# Patient Record
Sex: Male | Born: 1945 | Race: Black or African American | Hispanic: No | Marital: Single | Smoking: Former smoker
Health system: Southern US, Community
[De-identification: ages and names within clinical notes are randomized; demographics above are authoritative.]

## PROBLEM LIST (undated history)

## (undated) DIAGNOSIS — J449 Chronic obstructive pulmonary disease, unspecified: Secondary | ICD-10-CM

## (undated) DIAGNOSIS — F419 Anxiety disorder, unspecified: Secondary | ICD-10-CM

## (undated) DIAGNOSIS — I739 Peripheral vascular disease, unspecified: Secondary | ICD-10-CM

## (undated) DIAGNOSIS — I771 Stricture of artery: Secondary | ICD-10-CM

## (undated) DIAGNOSIS — E119 Type 2 diabetes mellitus without complications: Secondary | ICD-10-CM

## (undated) DIAGNOSIS — R079 Chest pain, unspecified: Secondary | ICD-10-CM

## (undated) HISTORY — DX: Stricture of artery: I77.1

## (undated) HISTORY — DX: Peripheral vascular disease, unspecified: I73.9

---

## 2006-04-04 ENCOUNTER — Inpatient Hospital Stay (HOSPITAL_COMMUNITY): Admission: EM | Admit: 2006-04-04 | Discharge: 2006-04-06 | Payer: Self-pay | Admitting: Emergency Medicine

## 2006-05-24 ENCOUNTER — Inpatient Hospital Stay (HOSPITAL_COMMUNITY): Admission: RE | Admit: 2006-05-24 | Discharge: 2006-05-28 | Payer: Self-pay | Admitting: Cardiology

## 2006-06-19 ENCOUNTER — Ambulatory Visit: Payer: Self-pay | Admitting: Critical Care Medicine

## 2006-07-31 ENCOUNTER — Ambulatory Visit: Payer: Self-pay | Admitting: Critical Care Medicine

## 2007-10-02 DIAGNOSIS — J438 Other emphysema: Secondary | ICD-10-CM

## 2007-10-02 DIAGNOSIS — J309 Allergic rhinitis, unspecified: Secondary | ICD-10-CM | POA: Insufficient documentation

## 2007-10-02 DIAGNOSIS — R519 Headache, unspecified: Secondary | ICD-10-CM | POA: Insufficient documentation

## 2007-10-02 DIAGNOSIS — J4489 Other specified chronic obstructive pulmonary disease: Secondary | ICD-10-CM | POA: Insufficient documentation

## 2007-10-02 DIAGNOSIS — I10 Essential (primary) hypertension: Secondary | ICD-10-CM | POA: Insufficient documentation

## 2007-10-02 DIAGNOSIS — I219 Acute myocardial infarction, unspecified: Secondary | ICD-10-CM | POA: Insufficient documentation

## 2007-10-02 DIAGNOSIS — J449 Chronic obstructive pulmonary disease, unspecified: Secondary | ICD-10-CM

## 2007-10-02 DIAGNOSIS — J984 Other disorders of lung: Secondary | ICD-10-CM | POA: Insufficient documentation

## 2007-10-02 DIAGNOSIS — R51 Headache: Secondary | ICD-10-CM | POA: Insufficient documentation

## 2007-10-02 DIAGNOSIS — E119 Type 2 diabetes mellitus without complications: Secondary | ICD-10-CM

## 2007-10-02 DIAGNOSIS — R7309 Other abnormal glucose: Secondary | ICD-10-CM

## 2011-03-10 NOTE — Cardiovascular Report (Signed)
Edwin [name], Edwin [name]              ACCOUNT NO.:  [id number]   MEDICAL RECORD NO.:  [id number]          PATIENT TYPE:  INP   LOCATION:  2807                         FACILITY:  [facility]   PHYSICIAN:  Eduardo Osier. Sharyn Lull, M.D. DATE OF BIRTH:  1946/07/04   DATE OF PROCEDURE:  04/04/2006  DATE OF DISCHARGE:                              CARDIAC CATHETERIZATION   PROCEDURE:  1.  Left cardiac cath with selective left and right coronary angiograph, LV-      graphy via the right groin using Judkins technique.  2.  Successful PTCA to mid-RCA using 2.5 x 9-mm long Maverick balloon.  3.  Successful deployment of 2.5 x 23-mm long Cypher drug-eluting stent in      mid-RCA.  4.  Successful post dilatation of this stent using 2.75 x 8-mm long      PowerSail balloon.   INDICATIONS FOR THE PROCEDURE:  Edwin [name] is a 65 year old black male with  past medical history significant for coronary artery disease, history of MI  in the past, hypertension, insulin-requiring diabetes mellitus, COPD,  tobacco abuse, peripheral vascular disease, optic hypertrophy, organic brain  syndrome, history of head trauma in the past.  She came to the ER via EMS  complaining of retrosternal squeezing chest pain radiating to the neck  associated with diaphoresis, nausea and vomiting for last 3 days.  The  patient did not seek any medical attention, but the chest pain got worse,  yesterday, so decided to come to the ER.  EKG done in the ER showed sinus  bradycardia with right bundle branch block with Q waves in inferior leads  with minimal ST elevation in anterior leads; and was noted to have elevated  CPK/MB of 43.5 and troponin of 6.89.  The patient was started on IV heparin  and nitrates in the ER with relief of chest pain.   The patient, again, this morning had chest pain and was noted to have  progressive elevation of cardiac enzymes.  Repeat EKG showed no new  significant changes, due to typical anginal chest pain; and  recent inferior  wall MI and post infarction angina and elevated cardiac enzymes discussed  with the patient regarding left cath, possible PTCA stenting its risks, i.e.  death, MI, stroke, need for emergency CABG, local vascular complications,  risk of restenosis etcetera; and consented for the procedure.   DESCRIPTION OF PROCEDURE:  After obtaining the informed consent the patient  was brought to the cath lab and was placed on the fluoroscopy table.  The  right groin was prepped and draped in the usual fashion.  Then 2% Xylocaine  was used for local anesthesia in the right groin.  With the help of thin-  wall needle, 6-French arterial and venous sheaths were placed.  Both the  sheaths were aspirated and flushed.   Next, the 6-French left Judkins catheter was advanced over the wire under  fluoroscopic guidance up to the ascending aorta.  Wire was pulled out, the  catheter was aspirated and connected to the manifold.  Catheter was further  advanced and engaged into the left coronary ostium.  Multiple views of the  left system were taken.   Next, the catheter was disengaged and was pulled out over the wire and was  replaced with 6-French. right Judkins catheter which was advanced over the  wire under fluoroscopic guidance up to the ascending aorta.  Wire was pulled  out the catheter was aspirated and the connected to the manifold.  Catheter  was further advanced and engaged into the right coronary ostium.  Multiple  views of the right system were taken.   Next, the catheter was disengaged and was pulled out over the wire and was  replaced with 6-French pigtail catheter which was advanced over the wire  under fluoroscopic guidance up to the ascending aorta.  Wire was pulled out,  the catheter was aspirated and connected to the manifold.  Catheter was  further advanced across the aortic valve into the LV.  LV pressures were  recorded.   Next LV graft was done in 30-degree RAO position.   Post angiographic  pressures were recorded from LV and then pullback pressures were recorded  from the aorta.  There was no gradient across the aortic valve.   Next the pigtail catheter was pulled out over the wire.  Sheaths were  aspirated and flushed.   FINDINGS:  1.  LV showed mild global hypokinesia, EF of 50% left main has 10-15% distal      stenosis.  2.  LAD has 10-15% proximal and mid stenosis.  Diagonal-1 is very small      which is less than 1 mm.  Diagonal-2 is small which has 20-30% ostial      stenosis.  3.  Left circumflex has 60-70% napkin ring stenosis in the midportion OM-1      has 50-60% ostial stenosis and 80-85% mid stenosis.  4.  OM-2 has 30-40% ostial stenosis and then it is diffusely diseased      distally after bifurcation.  OM-3 and OM-4 were very small.  OM-5 was      moderate size which has mid bifurcation stenosis.  Vessel beyond the      midportion is very small which is not suitable for PCI.  RCA has 90-95%      mid stenosis.  Vessel is nondominant with TIMI 2 flow which was the      culprit lesion for her recent MI.  RCA was nondominant as above.      Interventional procedure successful PTCA to mid RCA was done using 2.5 x      9-mm long Maverick balloon for predilatation and then 2.5 x 23-mm long      Cypher drug-eluting stent was deployed in mid RCA at 15 and __________      pressure stent was postdilated using 2.75 x 8 mm long PowerSail balloon.      Lesion was dilated from 90-95% to 0% rest with excellent TIMI grade 3      distal flow without evidence of dissection or distal embolization.  The      patient received weight-based Angiomax and 300 additional mg of Plavix      during the procedure.  The patient tolerated the procedure well.  There      are no complications.  The patient was transferred to recovery room in      stable condition.           ______________________________  Eduardo Osier Sharyn Lull, M.D.     MNH/MEDQ  D:  04/04/2006  T:   04/04/2006  Job:  [id number] 

## 2011-03-10 NOTE — Assessment & Plan Note (Signed)
[facility]                               PULMONARY OFFICE NOTE   NAME:[name], Edwin PICKERING                     MRN:          [id number]  DATE:07/31/2006                            DOB:          1945-10-24    [name] returns today in followup. A 65 year old African-American male  has a lung nodule and chronic obstructive lung disease. He is a group home  and is not getting his Spiriva on a consistent basis. He does have the  documented pulmonary nodule for which we are currently following on an  active basis. He has a repeat CT scan that is pending.   He is on:  1. Aspirin 325 mg daily.  2. Omeprazole 20 mg daily.  3. Trazodone 50 mg h.s.  4. Citalopram 20 mg daily.  5. Lorazepam 0.5 mg 1/4 tablet daily.  6. Plavix 75 mg daily.  7. Altace 2.5 mg daily.  8. Lipitor 20 mg daily.  9. Isosorbide 30 mg daily.  10.Metformin 1000 mg b.i.d.  11.Spiriva 1 capsule daily.   On exam, temperature 98, blood pressure 110/70, pulse 63, saturation 96% on  room air.  CHEST:  Showed distant breath sounds with prolonged expiratory phase. No  wheeze or rhonchi noted.  CARDIAC EXAM:  Showed a regular rate and rhythm without S3. Normal S1 and  S2.  ABDOMEN:  Soft, nontender.  EXTREMITIES:  Showed no edema or clubbing.  SKIN:  Clear.   IMPRESSION:  1. Primary emphysema. He needs to get his Spiriva on a more regular basis      for this. We will remind the rest home in this regard.  2. Pulmonary nodule. A repeat CT scan will be scheduled for January of      this coming year. Return the patient in followup in 4 months.       Charlcie Cradle Delford Field, MD, FCCP   PEW/MedQ  DD:  07/31/2006 DT:  08/02/2006 Job #:  [id number]   cc:   Eduardo Osier. Sharyn Lull, M.D.

## 2011-03-10 NOTE — Assessment & Plan Note (Signed)
[facility]                               PULMONARY OFFICE NOTE   NAME:Edwin [name], ADOM SCHOENECK                     MRN:          [id number]  DATE:06/19/2006                            DOB:          [birth year]    CHIEF COMPLAINT:  Evaluate lung nodules and dyspnea.   HISTORY OF PRESENT ILLNESS:  This 65 year old African-American male was  hospitalized between the 2nd and 6th of August with stable angina and  coronary artery disease and COPD exacerbation, and found to have very small  bilateral pulmonary nodules on CT scanning.  The patient was referred to our  office for evaluation.  He still smokes a pack a week and has been smoking  for 40 years.  He is dyspneic with activity and at rest, has a productive  cough.  Has some chest discomfort, has some irregular heartbeats.  No real  reflux symptoms, no change in weight, no difficulty swallowing.  The patient  has no nasal congestion, sneezing, itching, earaches or anxiety.  The  patient is staying at the [facility] at this time.  The patient  is not on any inhaled medications at this time.   PAST MEDICAL HISTORY:  1. Hypertension.  2. Heart attack three months ago.  3. Diabetes.  4. Hyperglycemia.  5. Allergies.  6. Chronic headaches.  7. Stent placed in July.   MEDICATION ALLERGIES:  None.   CURRENT MEDICATIONS:  1. Aspirin 325 mg daily.  2. Omeprazole 20 mg daily.  3. Trazodone 50 mg h.s.  4. Citalopram 20 mg daily.  5. Lorazepam 0.5 mg 1/4-tab daily.  6. Plavix 75 mg daily.  7. Altace 2.5 mg daily.  8. Lipitor 20 mg daily.  9. Isosorbide 30 mg daily.  10.Metformin 1000 mg b.i.d..   FAMILY HISTORY:  Allergies in brothers and sisters, cancer in a sister who  had lung cancer.   REVIEW OF SYSTEMS:  Otherwise noncontributory.   PHYSICAL EXAMINATION:  GENERAL:  This is a thin African-American male in no  distress.  VITAL SIGNS:  Temp 97.7, blood pressure 100/74, pulse 66,  saturation 99% on  room air.  CHEST:  Showed diminished breath sounds with prolonged  respiratory phase.  No wheeze or rhonchi noted.  CARDIAC:  Showed a regular rate and rhythm without S3, normal S1, S2.  ABDOMEN:  Soft, nontender.  EXTREMITIES:  No edema or clubbing.  SKIN:  Clear.  NEUROLOGIC:  Intact.  HEENT:  No jugular venous distention, no lymphadenopathy.  Oropharynx clear.  NECK:  Supple.   CT scan was obtained and reviewed and did show small bilateral noncalcified  lung nodules which are likely benign in nature.   IMPRESSION:  1. Moderate obstructive lung disease with chronic air flow obstruction.      For this, will begin Spiriva 1 capsule daily and encourage smoking      cessation.  2. Lung nodules.  We will follow it expectantly.  Will get a repeat CT      scan again in six months.  The patient will return to this office in  any case in four months for followup.                                   Charlcie Cradle Delford Field, MD, FCCP   PEW/MedQ  DD:  06/20/2006  DT:  06/21/2006  Job #:  [id number]   cc:   Eduardo Osier. Sharyn Lull, MD

## 2011-03-10 NOTE — Cardiovascular Report (Signed)
Edwin [name], Edwin [name]              ACCOUNT NO.:  [id number]   MEDICAL RECORD NO.:  [id number]          PATIENT TYPE:  INP   LOCATION:  2807                         FACILITY:  [facility]   PHYSICIAN:  Eduardo Osier. Sharyn Lull, M.D. DATE OF BIRTH:  Nov 01, 1945   DATE OF PROCEDURE:  05/24/2006  DATE OF DISCHARGE:                              CARDIAC CATHETERIZATION   PROCEDURE:  1.  Left cardiac catheterization with selective left and right coronary      angiography via right groin using Judkins technique.  2.  Successful PTCA to mid obtuse marginal 1 using 2 x 8 mm long and then      2.5 x 15 mm long Voyager balloon.  3.  Successful deployment of 2.5 x 23 mm long Cypher drug-eluting stent in      mid obtuse marginal 1.   INDICATION FOR THE PROCEDURE:  Edwin [name] is a 65 year old black male with  past medical history significant for coronary artery disease, history of  inferior wall MI in June of 2007.  Had PTCA and stenting to mid RCA.  Had  history of remote MI in the past, hypertension, insulin-requiring diabetes  mellitus, COPD, tobacco abuse, peripheral vascular disease, dementia,  history of head trauma in the past.  Complains of occasional retrosternal  chest pain without associated symptoms.  Patient had inferior wall MI in  June of 2007.  Subsequently had PCI to RCA and was noted to have 80-85% OM1  mid stenosis.  Patient is admitted for possible PCI to OM1.  Discussed with  patient and his sister regarding PTCA and stenting to OM1, its risks and  benefits, i.e., death, stroke, need for emergency CABG, risk of restenosis,  local vascular complications, etc. and consented for the procedure.   PROCEDURE:  After obtaining the informed consent patient was brought to the  catheterization laboratory and was placed on fluoroscopy table.  Right groin  was prepped and draped in usual fashion.  2% Xylocaine was used for local  anesthesia in the right groin.  With the help of thin-wall needle  6-French  arterial and 5-French venous sheaths were placed.  Both the sheaths were  aspirated and flushed.  Next, 6-French right Judkins catheter was advanced  over the wire under fluoroscopic guidance up to the ascending aorta.  Wire  was pulled out.  The catheter was aspirated and connected to the manifold.  Catheter was further advanced and engaged into right coronary ostium.  A  single view of right coronary artery was obtained.  Next, the catheter was  disengaged and was pulled out over the wire and was replaced with a 6-French  3 XB guiding catheter.  Was advanced over the wire under fluoroscopic  guidance up to the ascending aorta.  Wire was pulled out.  The catheter was  aspirated and connected to the manifold.  Catheter was further advanced and  engaged into left coronary ostium.  Multiple views of the left system were  taken.   FINDINGS:  LV was not done.  Left main was patent.  LAD has mild disease as  before.  Left circumflex  has mild disease as before.  OM1 has 50-60% ostial  stenosis and 95% mid stenosis with TIMI 3 flow.  OM2 was small which has 50-  60% mid stenosis.  RCA is nondominant which was patent at prior PTCA and  stented site.   INTERVENTIONAL PROCEDURE:  Successful PTCA to mid OM1 was done using 2 x 8  mm long Voyager balloon and then 2.5 x 15 mm long Voyager balloon for pre  dilatation going up to 8 atmospheres pressure.  Angiogram showed persistent  elastic recoil in mid OM1 and then a 2.5 x 23 mm long Cypher drug-eluting  stent was deployed at 10 atmospheres of pressure which was fully expanded  going up to 18 atmospheres pressure in mid OM1.  Lesion was dilated from 95%  to 0% residual with excellent TIMI grade 3 distal flow without evidence of  dissection or distal embolization.  Patient received weight-based Angiomax  and 300 mg of Plavix during the procedure.  Patient tolerated the procedure  well.  There were no complications.  Patient was transferred to  recovery  room in stable condition.           ______________________________  Eduardo Osier Sharyn Lull, M.D.     MNH/MEDQ  D:  05/24/2006  T:  05/24/2006  Job:  [id number]   cc:   Cath Lab

## 2011-03-10 NOTE — Discharge Summary (Signed)
NAMEGABE, Edwin [name]              ACCOUNT NO.:  [id number]   MEDICAL RECORD NO.:  [id number]          PATIENT TYPE:  INP   LOCATION:  3732                         FACILITY:  [facility]   PHYSICIAN:  Mohan N. Sharyn Lull, M.D. DATE OF BIRTH:  [birth year]   DATE OF ADMISSION:  04/03/2006  DATE OF DISCHARGE:  04/06/2006                                 DISCHARGE SUMMARY   ADMITTING DIAGNOSES:  1.  Probable recent inferior wall myocardial infarction post infarct angina.  2.  Coronary artery disease.  3.  History of myocardial infarction in the past.  4.  Hypertension.  5.  Insulin-requiring diabetes mellitus.  6.  Organic brain syndrome.  7.  Chronic obstructive pulmonary disease.  8.  Tobacco abuse.  9.  Peripheral vascular disease.  10. History of optic atrophy.  11. History of head trauma in the past.   FINAL DIAGNOSES:  1.  Status post recent inferior wall myocardial infarction.  2.  Status post infarct angina.  3.  Status post percutaneous transluminal coronary angioplasty stenting to      mid right coronary artery.  4.  Coronary artery disease.  5.  History of myocardial infarction in the past.  6.  Hypertension.  7.  Insulin-requiring diabetes mellitus.  8.  Organic brain syndrome.  9.  Chronic obstructive pulmonary disease.  10. Tobacco abuse.  11. Peripheral vascular disease.  12. History of optic atrophy.  13. History of head trauma in the past.   DISCHARGE MEDICATIONS:  1.  Enteric coated aspirin 325 mg 1 tablet daily.  2.  Plavix 75 mg 1 tablet daily with food.  3.  Altace 2.5 mg 1 capsule daily.  4.  Lipitor 20 mg 1 tablet daily.  5.  Omeprazole 20 mg 1 capsule daily.  6.  Imdur 30 mg 1 tablet daily in the morning.  7.  Albuterol meter-dose inhaler 2 puffs every 6 hours as before.  8.  Lantus insulin 10 units subcu daily at night as before.  9.  Celexa 20 mg daily as before.  10. Trazodone 50 mg one daily at night.  11. Nitrostat sublingual 0.4 mg as before.  12.  Tylenol 500 mg every 6 hours as needed for headache.   DIET:  Low salt, low cholesterol, 1800 calories ADA diet.   Post PTCA stent instructions have been given.   Follow-up with me in one week.   CONDITION AT DISCHARGE:  Stable.   BRIEF HISTORY AND HOSPITAL COURSE:  Edwin [name] is a 65 year old black male  with past medical history significant for multiple medical problems, i.e.,  coronary artery disease, history of MI in the past, hypertension, insulin-  requiring diabetes mellitus, COPD, tobacco abuse, PVD, optic atrophy,  organic brain syndrome, history of head trauma in the past.  He came to the  ER complaining of retrosternal chest squeezing, chest pain radiating to the  neck associated with diaphoresis, nausea and vomiting for last three days.  The patient did not seek medical attention and pain got worse yesterday and  today, so decided to come the ER by EMS.  EKG done in  the ER showed sinus  bradycardia, right bundle branch block with Q-waves in inferior leads with  minimal ST elevation and was noted to have elevated CPK-MB of 43.5 and  troponin of 6.89.  The patient was started on IV heparin and nitrates in the  ER with relief of chest pain.  The patient presently denies chest pain,  shortness of breath, nausea, vomiting or diaphoresis.   PAST MEDICAL HISTORY:  As above.   PAST SURGICAL HISTORY:  None.   ALLERGIES:  NO KNOWN DRUG ALLERGIES.   MEDICATION AT HOME:  1.  He was on aspirin 325 mg p.o. daily.  2.  Hydrochlorothiazide 12.5 mg p.o. daily.  3.  Nitrostat sublingual p.r.n.  4.  Albuterol inhaler.  5.  Trazodone 50 mg nightly.  6.  Prilosec 20 mg p.o. daily.  7.  Lantus 10 units nightly.  8.  Feosol 325 daily.  9.  Celexa 20 mg p.o. daily.  10. Ativan 0.125 mg p.o. daily.   PHYSICAL EXAMINATION:  GENERAL APPEARANCE:  He was alert, awake and oriented  x3 in no acute distress.  VITAL SIGNS:  Blood pressure was 134/90, pulse was 62.  HEENT:  Conjunctiva was  pink.  NECK:  Supple.  No JVD, no bruit.  LUNGS:  He has bilateral expiratory wheezing.  CARDIOVASCULAR:  S1 and S2 was normal.  There was soft S4 gallop.  ABDOMEN:  Soft.  Bowel sounds were present, nontender.  EXTREMITIES:  There is no clubbing, cyanosis or edema.   LABORATORY DATA:  His CPK-MB point of care was 45.  Repeat was 43.5.  Troponin I was 5.73 and 6.89.  His cholesterol was 193, HDL was 40, LDL was  140.  CPK-MB by labs, CK was 688, MB 64.9, relative index 9.4; second set CK  708, MB 63, related index 8.9; fourth set CK of 418, MB 27.6, relative index  6.6.  On April 05, 2006, CK was 334, MB 17.1, relative index 5.1.  Today his  CK is 166, MB 4.7, troponin has come down to 4.52.  His first troponin was  9.64, 12.88, 7.89, 6.13, today is 4.52.  His sodium was 139, potassium 3.7,  chloride 104, bicarb 29, glucose 91, BUN 9, creatinine 0.7.  His hemoglobin  A1c was 6.5. Hemoglobin was 15, hematocrit 45.7, white count of 11.5.   BRIEF HOSPITAL COURSE:  The patient was admitted to CCU.  The patient  underwent left cardiac cath with selective left and right coronary  angiography  and PTCA and stenting to RCA as per procedure report.  The  patient did not have any further episodes of chest pain following the  procedure.  His cardiac panels have gradually come down towards normal.  His  groin is stable with no evidence of hematoma.  The patient remains  bradycardiac but is completely asymptomatic.   The patient will be discharged home on above medications and will be  followed up in my office in one week.  The patient will be rescheduled for  PCI to obtuse marginal in a few weeks.           ______________________________  Eduardo Osier Sharyn Lull, M.D.     MNH/MEDQ  D:  04/06/2006  T:  04/06/2006  Job:  [id number]

## 2011-03-10 NOTE — Discharge Summary (Signed)
NAME:  Edwin [name], Edwin [name]              ACCOUNT NO.:  [id number]   MEDICAL RECORD NO.:  [id number]          PATIENT TYPE:  INP   LOCATION:  3736                         FACILITY:  [facility]   PHYSICIAN:  Edwin [name], M.D. DATE OF BIRTH:  1945-10-30   DATE OF ADMISSION:  05/24/2006  DATE OF DISCHARGE:  05/28/2006                                 DISCHARGE SUMMARY   ADMITTING DIAGNOSES:  1.  Stable angina.  2.  Coronary artery disease.  3.  History of recent inferior wall myocardial infarction in June 2007.  4.  Two-vessel coronary artery disease.  5.  Hypertension.  6.  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  7.  Hypercholesteremia.  8.  Dementia.  9.  History of head trauma.   FINAL DIAGNOSES:  1.  Status post exacerbation of chronic obstructive pulmonary disease.  2.  Very small bilateral pulmonary nodules.  3.  Coronary artery disease.  4.  Stable angina.  5.  Status post PTCA/stenting to obtuse marginal 1.  6.  History of recent inferior wall myocardial infarction.  7.  Status post PCI to right coronary artery in June of 2007.  8.  Hypertension.  9.  Dementia.  10. Diabetes mellitus controlled by diet.  11. Tobacco abuse.  12. History of head trauma in the past.  13. Hypercholesteremia.   DISCHARGE HOME MEDICATIONS:  1.  Enteric-coated aspirin 325 mg one tablet daily for one month and then 81      mg one tablet daily.  2.  Plavix 75 mg one tablet daily with food.  3.  Altace 2.5 mg one capsule daily.  4.  Lipitor 20 mg one tablet daily.  5.  Omeprazole 20 mg one capsule daily.  6.  Imdur 30 mg one tablet every morning.  7.  Albuterol two puffs four times per day as needed.  8.  Tylenol 500 mg every six to eight hours as needed for headache.  9.  Celexa 20 mg one daily.  10. Trazodone 50 mg one daily.  11. Avelox 400 mg one tablet daily for five more days.  12. Nitrostat 0.4 mg sublingual use as directed.   DIET:  Low salt, low cholesterol 1800 calories ADA  diet.  Post PTCA and  stent instructions have been given.  Follow up with me in one week and   Pulmonary in two weeks.   CONDITION AT DISCHARGE:  Stable.   BRIEF HISTORY AND HOSPITAL COURSE:  Edwin [name] is a 65 year old black male  with past medical history significant for coronary artery disease, history  of status post inferior wall MI in June of 2007, had PCI to RCA,  hypertension, insulin-requiring diabetes mellitus in the past, COPD, tobacco  abuse, peripheral vascular disease, history of head trauma, dementia.  Complains of occasional retrosternal chest pain without associated symptoms.  Patient had inferior wall MI in June of 2007 requiring PCI to RCA and was  noted to have 80-85% OM1 mid stenosis.  Patient is admitted for possible PCI  to OM1.  Patient denies any palpitation, lightheadedness, or syncope.  Denies shortness of breath.  PAST MEDICAL HISTORY:  As above.   PAST SURGICAL HISTORY:  None.   ALLERGIES:  None.   MEDICATION AT HOME:  1.  Aspirin 325 mg p.o. daily.  2.  Plavix 75 mg p.o. daily.  3.  Lipitor 20 mg p.o. daily.  4.  Omeprazole 20 mg p.o. daily.  5.  Imdur 30 mg p.o. q.a.m.  6.  Lantus insulin was discontinued.  7.  Celexa 20 mg p.o. daily.  8.  Trazodone 50 mg p.o. daily.  9.  Albuterol two puffs q.i.d.   SOCIAL HISTORY:  He is widowed.  Resident of [facility].  Smoked two  to three packs per day for 40+ years.  Used to drink socially.  Worked as  Education administrator in the past.  Born in [place].   FAMILY HISTORY:  Noncontributory.   PHYSICAL EXAMINATION:  GENERAL:  He was alert, awake, oriented x3.  No acute  distress.  VITAL SIGNS:  Blood pressure was 110/70, pulse was 64, regular.  HEENT:  Conjunctivae was pink.  NECK:  Supple.  No JVD, no bruit.  LUNGS:  Clear to auscultation without rhonchi or rales.  CARDIOVASCULAR:  S1, S2 was normal.  There was soft systolic murmur.  There  was no S3 gallop.  ABDOMEN:  Soft.  Bowel sounds were  present, nontender.  EXTREMITIES:  There is no clubbing, cyanosis, or edema.   LABORATORIES:  Chest x-ray preoperative done on August 2 showed three vague  nodular densities in the left lung which were new as compared to prior chest  x-ray done in June of 2007.  Otherwise, there was no acute cardiopulmonary  findings, stable hyperinflation and probable changes of COPD.  CT of the  chest done on August 4 showed bilateral intraparenchymal and subpleural  pulmonary nodules all measuring less than 1 cm.  There were also  hypervascular lesions identified within the liver.  His other laboratories:  Hemoglobin was 12.9, hematocrit 38.8, white count of 15.8.  Repeat  hemoglobin on today is 13.3, hematocrit 39.9, white count of 9.4 which has  been stable.  His white count has come down significantly from 15.8 to 9.4.  His sodium was 142, potassium 3.7, blood sugar was 102, bicarbonate was 27,  BUN 10, creatinine 0.8.  Post procedure CPK was 133, MB 5.   BRIEF HOSPITAL COURSE:  Patient was a.m. admit and underwent left cardiac  catheterization and PTCA stenting to OM1 as per procedure report.  Chest x-  ray, as stated above, showed nonspecific pulmonary nodules requiring CT of  the chest which showed bilateral very small pulmonary nodules.  Patient did  not have any episodes of chest pain during the hospital stay.  Patient did  had coughing with expiratory wheezing requiring bronchodilator therapy and  Avelox with improvement in his symptoms.  Patient remained afebrile during  the hospital stay.  Phase I cardiac rehab was called.  Patient has been  ambulating in hallway without any problems.  His groin is stable.  Spoke  with Dr. Shan [name] regarding bilateral pulmonary nodules and will be  followed up with [facility] as outpatient in few weeks.           ______________________________  Edwin [name], M.D.    MNH/MEDQ  D:  05/28/2006  T:  05/28/2006  Job:  [id number]   cc:    Edwin [name], M.D. [facility]

## 2013-07-19 ENCOUNTER — Emergency Department (HOSPITAL_COMMUNITY)
Admission: EM | Admit: 2013-07-19 | Discharge: 2013-07-19 | Disposition: A | Discharge status: Home / Self Care | Payer: Medicare Other | Attending: Emergency Medicine | Admitting: Emergency Medicine

## 2013-07-19 ENCOUNTER — Encounter (HOSPITAL_COMMUNITY): Payer: Self-pay | Admitting: Emergency Medicine

## 2013-07-19 ENCOUNTER — Emergency Department (HOSPITAL_COMMUNITY): Payer: Medicare Other

## 2013-07-19 DIAGNOSIS — J441 Chronic obstructive pulmonary disease with (acute) exacerbation: Secondary | ICD-10-CM | POA: Insufficient documentation

## 2013-07-19 DIAGNOSIS — F0390 Unspecified dementia without behavioral disturbance: Secondary | ICD-10-CM | POA: Insufficient documentation

## 2013-07-19 DIAGNOSIS — F17200 Nicotine dependence, unspecified, uncomplicated: Secondary | ICD-10-CM | POA: Insufficient documentation

## 2013-07-19 HISTORY — DX: Chronic obstructive pulmonary disease, unspecified: J44.9

## 2013-07-19 HISTORY — DX: Type 2 diabetes mellitus without complications: E11.9

## 2013-07-19 HISTORY — DX: Chest pain, unspecified: R07.9

## 2013-07-19 LAB — CBC WITH DIFFERENTIAL/PLATELET
Hemoglobin: 14.8 g/dL (ref 13.0–17.0)
Lymphocytes Relative: 25 % (ref 12–46)
Lymphs Abs: 2.4 10*3/uL (ref 0.7–4.0)
MCV: 88.5 fL (ref 78.0–100.0)
Neutro Abs: 6.6 10*3/uL (ref 1.7–7.7)
Neutrophils Relative %: 69 % (ref 43–77)
Platelets: 188 10*3/uL (ref 150–400)
WBC: 9.6 10*3/uL (ref 4.0–10.5)

## 2013-07-19 LAB — COMPREHENSIVE METABOLIC PANEL
BUN: 15 mg/dL (ref 6–23)
Calcium: 9.6 mg/dL (ref 8.4–10.5)
Chloride: 99 mEq/L (ref 96–112)
Creatinine, Ser: 0.92 mg/dL (ref 0.50–1.35)
GFR calc non Af Amer: 85 mL/min — ABNORMAL LOW (ref >90)
Sodium: 139 mEq/L (ref 135–145)
Total Bilirubin: 0.3 mg/dL (ref 0.3–1.2)

## 2013-07-19 LAB — POCT I-STAT TROPONIN I: Troponin i, poc: 0 ng/mL (ref 0.00–0.08)

## 2013-07-19 LAB — PRO B NATRIURETIC PEPTIDE: Pro B Natriuretic peptide (BNP): 341.4 pg/mL — ABNORMAL HIGH (ref 0–125)

## 2013-07-19 MED ORDER — ALBUTEROL SULFATE (5 MG/ML) 0.5% IN NEBU
5.0000 mg | INHALATION_SOLUTION | Freq: Once | RESPIRATORY_TRACT | Status: AC
  Administered 2013-07-19: 5 mg via RESPIRATORY_TRACT
  Filled 2013-07-19: qty 1

## 2013-07-19 MED ORDER — IPRATROPIUM BROMIDE 0.02 % IN SOLN
0.5000 mg | Freq: Once | RESPIRATORY_TRACT | Status: AC
  Administered 2013-07-19: 0.5 mg via RESPIRATORY_TRACT
  Filled 2013-07-19: qty 2.5

## 2013-07-19 MED ORDER — PREDNISONE (PAK) 10 MG PO TABS: 10.0000 mg | ORAL_TABLET | Freq: Every day | ORAL | Status: DC

## 2013-07-19 MED ORDER — METHYLPREDNISOLONE SODIUM SUCC 125 MG IJ SOLR
125.0000 mg | Freq: Once | INTRAMUSCULAR | Status: AC
  Administered 2013-07-19: 125 mg via INTRAVENOUS
  Filled 2013-07-19: qty 2

## 2013-07-19 NOTE — ED Notes (Signed)
Pt arrived from [facility] with c/o SOB. Pt lung sounds are wheezing and diminished. Hx of COPD and current smoker. EMS administered Albuterol neb tx. O2sat 100% at this time. BP-110/55 HR-80 No complaints of pain or SOB at this time.

## 2013-07-19 NOTE — ED Provider Notes (Signed)
Medical screening examination/treatment/procedure(s) were conducted as a shared visit with non-physician practitioner(s) and myself.  I personally evaluated the patient during the encounter  Flint Melter, MD 07/19/13 [phone]

## 2013-07-19 NOTE — ED Provider Notes (Signed)
CSN: [id number]     Arrival date & time 07/19/13  1043 History   First MD Initiated Contact with Patient 07/19/13 1045     Chief Complaint  Patient presents with  . Shortness of Breath   (Consider location/radiation/quality/duration/timing/severity/associated sxs/prior Treatment) Patient is a 67 y.o. male presenting with shortness of breath. The history is provided by the patient, the nursing home and the EMS personnel.  Shortness of Breath Associated symptoms: cough and wheezing   Associated symptoms: no abdominal pain, no chest pain, no fever, no sore throat and no vomiting   Per EMS, they were called to nursing home for patient's complaints of SOB.  Upon arrival, pt denied any complaints.  States to me that he has had gradually worsening SOB and dry cough for 3-4 months.  Also with rhinorrhea. Denies any chest pain.  Denies fever, chills, sore throat, abdominal pain, N/V/D.  Pt lives in [facility].  Per EMS there was no concern by staff for AMS and no hx dementia in patient's record.  Patient denies having any hx COPD or lung problems, though he was previously seen for COPD. States he does not know where he lives and does not know what medications he takes.  11:18 AM I spoke with "Tisha" - staff member at [facility].  Morrison Old states patient was complaining of chest pain, cold, flu-like symptoms since last week.  Was seen by Doctor 4 days ago who prescribed mucinex without any improvement.  Morrison Old states that patient's orientation only to self and lack of knowledge of where he lives or his medical problems is baseline for him, that he has been like that since April when he came to live there. Pt has not formally been diagnosed with dementia.   Level V caveat for ?dementia   No past medical history on file. No past surgical history on file. No family history on file. History  Substance Use Topics  . Smoking status: Not on file  . Smokeless tobacco: Not on file  . Alcohol Use: Not on file     Review of Systems  Unable to perform ROS: Dementia  Constitutional: Negative for fever and chills.  HENT: Positive for rhinorrhea. Negative for sore throat.   Respiratory: Positive for cough, shortness of breath and wheezing.   Cardiovascular: Negative for chest pain.  Gastrointestinal: Negative for nausea, vomiting, abdominal pain and diarrhea.    Allergies  Review of patient's allergies indicates no known allergies.  Home Medications  No current outpatient prescriptions on file. There were no vitals taken for this visit. Physical Exam  Nursing note and vitals reviewed. Constitutional: He appears well-developed and well-nourished. No distress.  HENT:  Head: Normocephalic and atraumatic.  Neck: Neck supple.  Cardiovascular: Normal rate and regular rhythm.   Pulmonary/Chest: Effort normal. No respiratory distress. He has no decreased breath sounds. He has wheezes. He has no rhonchi. He has no rales.  Diffuse respiratory wheezes  Abdominal: Soft. He exhibits no distension and no mass. There is no tenderness. There is no rebound and no guarding.  Musculoskeletal: He exhibits no edema.  Neurological: He is alert. He exhibits normal muscle tone.  Oriented to self.  Knows he is in the hospital and in [place] and he knows he is here for SOB.  Does not know day, month, year, or president.  When asked to repeat 3 words back to me he is gets 2/3 correct each time immediately.   Skin: He is not diaphoretic.    ED  Course  Procedures (including critical care time) Labs Review Labs Reviewed  CBC WITH DIFFERENTIAL - Abnormal; Notable for the following:    MCHC 36.2 (*)    All other components within normal limits  COMPREHENSIVE METABOLIC PANEL - Abnormal; Notable for the following:    Glucose, Bld 158 (*)    GFR calc non Af Amer 85 (*)    All other components within normal limits  PRO B NATRIURETIC PEPTIDE - Abnormal; Notable for the following:    Pro B Natriuretic peptide (BNP)  341.4 (*)    All other components within normal limits  POCT I-STAT TROPONIN I  POCT I-STAT TROPONIN I   Imaging Review Dg Chest 2 View  07/19/2013   CLINICAL DATA:  Initial evaluation for COPD exacerbation with shortness of breath. Chronic cough.  EXAM: CHEST  2 VIEW  COMPARISON:  Two-view chest x-ray 05/24/2006, 04/03/2006. CT chest 05/26/2006.  FINDINGS: Cardiac silhouette normal in size, unchanged. Thoracic aorta mildly atherosclerotic, unchanged. Hilar and mediastinal contours otherwise unremarkable. Hyperinflation with emphysematous changes in the upper lobes. Biapical parenchymal scarring, unchanged. Cyber poor pulmonary nodule identified on the prior CT are not visualized on the current examination a and therefore have certainly not increased in size in the 7 year interval, indicating benignity. No confluent airspace consolidation. No pleural effusions. Visualized bony thorax intact.  IMPRESSION: COPD/emphysema. No acute cardiopulmonary disease.   Electronically Signed   By: Hulan Saas   On: 07/19/2013 12:28    11:13 AM I spoke with Morrison Old from [facility] (please see HPI for detail)  Filed Vitals:   07/19/13 1400  BP: 126/88  Pulse:   Temp:   Resp: 19     Date: 07/19/2013  Rate: 95  Rhythm: normal sinus rhythm and ventricular bigeminy  QRS Axis: normal  Intervals: normal  ST/T Wave abnormalities: nonspecific ST/T changes  Conduction Disutrbances:right bundle branch block  Narrative Interpretation:   Old EKG Reviewed: none available    11:55 AM Dr Effie Shy made aware of patient.   Wheezing much improved after breathing treatment.  Pt reports he feels much better, denies SOB.    MDM   1. COPD exacerbation    Pt with hx COPD, active smoker, presents with gradually worsening SOB and cough/cold symptoms x 1-2 weeks. Per nursing home staff he is at his baseline mentally.  Likely has dementia.  No other concerns.  Pt wheezing on exam initially, improved with neb  treatment.  D/C home with prednisone.  Repeat troponins negative. Pt denies chest pain throughout visit. Nonischemic EKG.  Likely COPD exacerbation.  Pt feeling much better after neb treatment.  Pt d/c back to facility with return precautions.     Trixie Dredge, PA-C 07/19/13 1528

## 2013-07-19 NOTE — ED Provider Notes (Signed)
  Face-to-face evaluation   History: Patient sent here from his facility for evaluation of shortness of breath. The patient is asymptomatic, and does not know why.   Physical exam: Alert, elderly man in mild distress. Chest nontender to palpation. Heart regular rate and rhythm. Lungs clear to auscultation. Neurologic- alert and oriented to place, only. Normal strength and motion in arms, and legs, bilaterally.  Medical screening examination/treatment/procedure(s) were conducted as a shared visit with non-physician practitioner(s) and myself.  I personally evaluated the patient during the encounter  Flint Melter, MD 07/19/13 [phone]

## 2013-07-19 NOTE — ED Notes (Signed)
PTAR paged. 

## 2013-08-13 ENCOUNTER — Encounter (HOSPITAL_COMMUNITY): Payer: Self-pay | Admitting: Emergency Medicine

## 2013-08-13 ENCOUNTER — Emergency Department (HOSPITAL_COMMUNITY)
Admission: EM | Admit: 2013-08-13 | Discharge: 2013-08-13 | Disposition: A | Discharge status: Home / Self Care | Payer: Medicare Other | Attending: Emergency Medicine | Admitting: Emergency Medicine

## 2013-08-13 DIAGNOSIS — J449 Chronic obstructive pulmonary disease, unspecified: Secondary | ICD-10-CM | POA: Insufficient documentation

## 2013-08-13 DIAGNOSIS — E119 Type 2 diabetes mellitus without complications: Secondary | ICD-10-CM | POA: Insufficient documentation

## 2013-08-13 DIAGNOSIS — E7800 Pure hypercholesterolemia, unspecified: Secondary | ICD-10-CM | POA: Insufficient documentation

## 2013-08-13 DIAGNOSIS — F17200 Nicotine dependence, unspecified, uncomplicated: Secondary | ICD-10-CM | POA: Insufficient documentation

## 2013-08-13 DIAGNOSIS — H5789 Other specified disorders of eye and adnexa: Secondary | ICD-10-CM | POA: Insufficient documentation

## 2013-08-13 DIAGNOSIS — T7840XA Allergy, unspecified, initial encounter: Secondary | ICD-10-CM

## 2013-08-13 DIAGNOSIS — Z79899 Other long term (current) drug therapy: Secondary | ICD-10-CM | POA: Insufficient documentation

## 2013-08-13 DIAGNOSIS — IMO0002 Reserved for concepts with insufficient information to code with codable children: Secondary | ICD-10-CM | POA: Insufficient documentation

## 2013-08-13 DIAGNOSIS — F411 Generalized anxiety disorder: Secondary | ICD-10-CM | POA: Insufficient documentation

## 2013-08-13 DIAGNOSIS — J4489 Other specified chronic obstructive pulmonary disease: Secondary | ICD-10-CM | POA: Insufficient documentation

## 2013-08-13 HISTORY — DX: Anxiety disorder, unspecified: F41.9

## 2013-08-13 MED ORDER — PREDNISONE 20 MG PO TABS: ORAL_TABLET | ORAL | Status: DC

## 2013-08-13 MED ORDER — FAMOTIDINE 20 MG PO TABS
20.0000 mg | ORAL_TABLET | Freq: Once | ORAL | Status: AC
  Administered 2013-08-13: 20 mg via ORAL
  Filled 2013-08-13: qty 1

## 2013-08-13 MED ORDER — FLUORESCEIN SODIUM 1 MG OP STRP
1.0000 | ORAL_STRIP | Freq: Once | OPHTHALMIC | Status: AC
  Administered 2013-08-13: 1 via OPHTHALMIC
  Filled 2013-08-13: qty 1

## 2013-08-13 MED ORDER — DIPHENHYDRAMINE HCL 25 MG PO TABS: 25.0000 mg | ORAL_TABLET | Freq: Four times a day (QID) | ORAL | Status: DC

## 2013-08-13 MED ORDER — DIPHENHYDRAMINE HCL 25 MG PO CAPS
25.0000 mg | ORAL_CAPSULE | Freq: Once | ORAL | Status: AC
  Administered 2013-08-13: 25 mg via ORAL
  Filled 2013-08-13: qty 1

## 2013-08-13 MED ORDER — TETRACAINE HCL 0.5 % OP SOLN
1.0000 [drp] | Freq: Once | OPHTHALMIC | Status: AC
  Administered 2013-08-13: 1 [drp] via OPHTHALMIC
  Filled 2013-08-13: qty 2

## 2013-08-13 MED ORDER — PREDNISONE 20 MG PO TABS
60.0000 mg | ORAL_TABLET | Freq: Once | ORAL | Status: AC
  Administered 2013-08-13: 60 mg via ORAL
  Filled 2013-08-13: qty 3

## 2013-08-13 MED ORDER — FAMOTIDINE 20 MG PO TABS: 20.0000 mg | ORAL_TABLET | Freq: Two times a day (BID) | ORAL | Status: DC

## 2013-08-13 NOTE — ED Notes (Signed)
PTAR in to transport pt.

## 2013-08-13 NOTE — ED Notes (Signed)
Rt eye swolllen since getting up this am denies injury lives in assisted living 130/88 hr 72

## 2013-08-13 NOTE — ED Provider Notes (Signed)
CSN: [id number]     Arrival date & time 08/13/13  1039 History  This chart was scribed for non-physician practitioner Junious Silk, PA-C working with Laray Anger, DO by Danella Maiers, ED Scribe. This patient was seen in room TR04C/TR04C and the patient's care was started at 10:51 AM.   Chief Complaint  Patient presents with  . Eye Pain   The history is provided by the patient. No language interpreter was used.   HPI Comments: Edwin [name] is a 67 y.o. male with a h/o DM, high cholesterol who presents to the Emergency Department from Assisted Living complaining of swelling to the right eye since he woke this morning. He denies injuries or falls, pain, itching, visual changes, drainage. He does not have pain with EOMs, photophobia. He does not wear glasses or contacts. He denies h/o of similar problem. He denies existing allergies or new exposures.   Past Medical History  Diagnosis Date  . COPD (chronic obstructive pulmonary disease)   . Diabetes mellitus without complication   . Chest pain   . Anxiety    History reviewed. No pertinent past surgical history. No family history on file. History  Substance Use Topics  . Smoking status: Current Every Day Smoker  . Smokeless tobacco: Not on file  . Alcohol Use: No    Review of Systems  Eyes: Negative for pain, discharge, itching and visual disturbance.  All other systems reviewed and are negative.    Allergies  Review of patient's allergies indicates no known allergies.  Home Medications   Current Outpatient Rx  Name  Route  Sig  Dispense  Refill  . albuterol (PROVENTIL HFA;VENTOLIN HFA) 108 (90 BASE) MCG/ACT inhaler   Inhalation   Inhale 2 puffs into the lungs 4 (four) times daily.         Marland Kitchen dextromethorphan-guaiFENesin (MUCINEX DM) 30-600 MG per 12 hr tablet   Oral   Take 1 tablet by mouth every 12 (twelve) hours. Take for 14 days.  Started on 07/17/13         . metFORMIN (GLUCOPHAGE) 500 MG tablet    Oral   Take 500 mg by mouth daily.         . nitroGLYCERIN (NITROSTAT) 0.4 MG SL tablet   Sublingual   Place 0.4 mg under the tongue every 5 (five) minutes as needed for chest pain.         . predniSONE (STERAPRED UNI-PAK) 10 MG tablet   Oral   Take 1 tablet (10 mg total) by mouth daily. Day 1: take 6 tabs.  Day 2: 5 tabs  Day 3: 4 tabs  Day 4: 3 tabs  Day 5: 2 tabs  Day 6: 1 tab   21 tablet   0   . risperiDONE (RISPERDAL) 2 MG tablet   Oral   Take 1 mg by mouth at bedtime.         . sertraline (ZOLOFT) 25 MG tablet   Oral   Take 25 mg by mouth daily.         . simvastatin (ZOCOR) 20 MG tablet   Oral   Take 20 mg by mouth every evening.         . traZODone (DESYREL) 50 MG tablet   Oral   Take 50 mg by mouth at bedtime.         . triamterene-hydrochlorothiazide (MAXZIDE-25) 37.5-25 MG per tablet   Oral   Take 0.5 tablets by mouth daily.  BP 114/76  Pulse 90  Temp(Src) 97 F (36.1 C) (Oral)  SpO2 98% Physical Exam  Nursing note and vitals reviewed. Constitutional: He is oriented to person, place, and time. He appears well-developed and well-nourished. No distress.  HENT:  Head: Normocephalic and atraumatic.  Right Ear: External ear normal.  Left Ear: External ear normal.  Nose: Nose normal.  Eyes: Conjunctivae and EOM are normal. Pupils are equal, round, and reactive to light. Lids are everted and swept, no foreign bodies found. Right eye exhibits no chemosis, no discharge, no exudate and no hordeolum. No foreign body present in the right eye. Left eye exhibits no chemosis, no discharge, no exudate and no hordeolum. No foreign body present in the left eye. Right conjunctiva is not injected. Right conjunctiva has no hemorrhage. Left conjunctiva is not injected. Left conjunctiva has no hemorrhage. No scleral icterus. Right eye exhibits normal extraocular motion and no nystagmus. Left eye exhibits normal extraocular motion and no nystagmus. Right pupil  is round and reactive. Left pupil is round and reactive. Pupils are equal.  Fundoscopic exam:      The left eye shows no hemorrhage.  Slit lamp exam:      The right eye shows no corneal abrasion and no fluorescein uptake.       The left eye shows no corneal abrasion, no corneal flare, no corneal ulcer, no foreign body, no hyphema, no hypopyon and no fluorescein uptake.  Edema to the right upper and lower eyelid. No pain with EOMs. PERRL, no photophobia. No conjunctival injection. No corneal abrasians.  Neck: Normal range of motion. No tracheal deviation present.  Cardiovascular: Normal rate, regular rhythm and normal heart sounds.   Pulmonary/Chest: Effort normal and breath sounds normal. No stridor.  Abdominal: Soft. He exhibits no distension. There is no tenderness.  Musculoskeletal: Normal range of motion.  Neurological: He is alert and oriented to person, place, and time.  Skin: Skin is warm and dry. He is not diaphoretic.     Psychiatric: He has a normal mood and affect. His behavior is normal.    ED Course  Procedures (including critical care time) Medications - No data to display  DIAGNOSTIC STUDIES: Oxygen Saturation is 98% on RA, normal by my interpretation.    COORDINATION OF CARE: 11:13 AM- Discussed treatment plan with pt which includes benadryl and antibiotics. Pt agrees to plan.    Labs Review Labs Reviewed - No data to display Imaging Review No results found.  EKG Interpretation   None       MDM   1. Allergic reaction, initial encounter    Patient re-evaluated prior to dc, is hemodynamically stable, in no respiratory distress, and denies the feeling of throat closing. Pt has been advised to take OTC benadryl & return to the ED if they have a mod-severe allergic rxn (s/s including throat closing, difficulty breathing, swelling of lips face or tongue). Pt is to follow up with their PCP. No dye uptake seen on exam. No corneal abrasions, dendritic lesions. EOMs  normal. No photophobia or visual disturbance. Pt is agreeable with plan & verbalizes understanding. I discussed this case with Dr. Clarene Duke who agrees with plan.    I personally performed the services described in this documentation, which was scribed in my presence. The recorded information has been reviewed and is accurate.    Mora Bellman, PA-C 08/13/13 1556  Mora Bellman, PA-C 08/13/13 1557

## 2013-08-13 NOTE — ED Notes (Signed)
PT[facility] called to return pt to [facility].

## 2013-08-16 NOTE — ED Provider Notes (Signed)
Medical screening examination/treatment/procedure(s) were performed by non-physician practitioner and as supervising physician I was immediately available for consultation/collaboration.  EKG Interpretation   None         Laray Anger, DO 08/16/13 1226

## 2013-11-20 ENCOUNTER — Other Ambulatory Visit: Payer: Self-pay | Admitting: Gastroenterology

## 2013-11-20 DIAGNOSIS — R109 Unspecified abdominal pain: Secondary | ICD-10-CM

## 2013-11-26 ENCOUNTER — Ambulatory Visit
Admission: RE | Admit: 2013-11-26 | Discharge: 2013-11-26 | Disposition: A | Discharge status: Home / Self Care | Payer: Medicare Other | Source: Ambulatory Visit | Attending: Gastroenterology | Admitting: Gastroenterology

## 2013-11-26 DIAGNOSIS — R109 Unspecified abdominal pain: Secondary | ICD-10-CM

## 2015-02-23 ENCOUNTER — Emergency Department (HOSPITAL_COMMUNITY): Payer: Medicare Other

## 2015-02-23 ENCOUNTER — Emergency Department (HOSPITAL_COMMUNITY)
Admission: EM | Admit: 2015-02-23 | Discharge: 2015-03-02 | Disposition: A | Discharge status: Home / Self Care | Payer: Medicare Other | Attending: Emergency Medicine | Admitting: Emergency Medicine

## 2015-02-23 ENCOUNTER — Encounter (HOSPITAL_COMMUNITY): Payer: Self-pay | Admitting: Emergency Medicine

## 2015-02-23 DIAGNOSIS — Z720 Tobacco use: Secondary | ICD-10-CM | POA: Insufficient documentation

## 2015-02-23 DIAGNOSIS — J449 Chronic obstructive pulmonary disease, unspecified: Secondary | ICD-10-CM | POA: Insufficient documentation

## 2015-02-23 DIAGNOSIS — N183 Chronic kidney disease, stage 3 unspecified: Secondary | ICD-10-CM

## 2015-02-23 DIAGNOSIS — N289 Disorder of kidney and ureter, unspecified: Secondary | ICD-10-CM | POA: Diagnosis not present

## 2015-02-23 DIAGNOSIS — Z79899 Other long term (current) drug therapy: Secondary | ICD-10-CM | POA: Diagnosis not present

## 2015-02-23 DIAGNOSIS — R627 Adult failure to thrive: Secondary | ICD-10-CM | POA: Diagnosis not present

## 2015-02-23 DIAGNOSIS — F39 Unspecified mood [affective] disorder: Secondary | ICD-10-CM | POA: Diagnosis not present

## 2015-02-23 DIAGNOSIS — R739 Hyperglycemia, unspecified: Secondary | ICD-10-CM

## 2015-02-23 DIAGNOSIS — F419 Anxiety disorder, unspecified: Secondary | ICD-10-CM | POA: Diagnosis not present

## 2015-02-23 DIAGNOSIS — F0391 Unspecified dementia with behavioral disturbance: Secondary | ICD-10-CM | POA: Diagnosis not present

## 2015-02-23 DIAGNOSIS — F0281 Dementia in other diseases classified elsewhere with behavioral disturbance: Secondary | ICD-10-CM | POA: Insufficient documentation

## 2015-02-23 DIAGNOSIS — Z01818 Encounter for other preprocedural examination: Secondary | ICD-10-CM

## 2015-02-23 DIAGNOSIS — Z046 Encounter for general psychiatric examination, requested by authority: Secondary | ICD-10-CM | POA: Diagnosis present

## 2015-02-23 DIAGNOSIS — E1165 Type 2 diabetes mellitus with hyperglycemia: Secondary | ICD-10-CM | POA: Insufficient documentation

## 2015-02-23 DIAGNOSIS — F03918 Unspecified dementia, unspecified severity, with other behavioral disturbance: Secondary | ICD-10-CM | POA: Diagnosis present

## 2015-02-23 LAB — URINALYSIS, ROUTINE W REFLEX MICROSCOPIC
Bilirubin Urine: NEGATIVE
Glucose, UA: 250 mg/dL — AB
Hgb urine dipstick: NEGATIVE
KETONES UR: NEGATIVE mg/dL
NITRITE: NEGATIVE
Protein, ur: NEGATIVE mg/dL
SPECIFIC GRAVITY, URINE: 1.012 (ref 1.005–1.030)
Urobilinogen, UA: 1 mg/dL (ref 0.0–1.0)
pH: 7.5 (ref 5.0–8.0)

## 2015-02-23 LAB — BASIC METABOLIC PANEL
ANION GAP: 10 (ref 5–15)
BUN: 38 mg/dL — ABNORMAL HIGH (ref 6–20)
CO2: 29 mmol/L (ref 22–32)
Calcium: 9.5 mg/dL (ref 8.9–10.3)
Chloride: 103 mmol/L (ref 101–111)
Creatinine, Ser: 1.38 mg/dL — ABNORMAL HIGH (ref 0.61–1.24)
GFR calc Af Amer: 59 mL/min — ABNORMAL LOW (ref >60)
GFR, EST NON AFRICAN AMERICAN: 51 mL/min — AB (ref >60)
GLUCOSE: 496 mg/dL — AB (ref 70–99)
Potassium: 4.3 mmol/L (ref 3.5–5.1)
SODIUM: 142 mmol/L (ref 135–145)

## 2015-02-23 LAB — COMPREHENSIVE METABOLIC PANEL
ALBUMIN: 4.4 g/dL (ref 3.5–5.0)
ALT: 11 U/L — AB (ref 17–63)
AST: 13 U/L — ABNORMAL LOW (ref 15–41)
Alkaline Phosphatase: 81 U/L (ref 38–126)
Anion gap: 14 (ref 5–15)
BILIRUBIN TOTAL: 0.7 mg/dL (ref 0.3–1.2)
BUN: 32 mg/dL — AB (ref 6–20)
CHLORIDE: 106 mmol/L (ref 101–111)
CO2: 28 mmol/L (ref 22–32)
Calcium: 10.1 mg/dL (ref 8.9–10.3)
Creatinine, Ser: 1.1 mg/dL (ref 0.61–1.24)
GFR calc Af Amer: >60 mL/min (ref >60)
GFR calc non Af Amer: >60 mL/min (ref >60)
Glucose, Bld: 393 mg/dL — ABNORMAL HIGH (ref 70–99)
POTASSIUM: 4.4 mmol/L (ref 3.5–5.1)
SODIUM: 148 mmol/L — AB (ref 135–145)
TOTAL PROTEIN: 8.7 g/dL — AB (ref 6.5–8.1)

## 2015-02-23 LAB — RAPID URINE DRUG SCREEN, HOSP PERFORMED
AMPHETAMINES: NOT DETECTED
BENZODIAZEPINES: NOT DETECTED
Barbiturates: NOT DETECTED
Cocaine: NOT DETECTED
OPIATES: NOT DETECTED
TETRAHYDROCANNABINOL: NOT DETECTED

## 2015-02-23 LAB — CBC
HCT: 41.6 % (ref 39.0–52.0)
HEMOGLOBIN: 13.4 g/dL (ref 13.0–17.0)
MCH: 28.9 pg (ref 26.0–34.0)
MCHC: 32.2 g/dL (ref 30.0–36.0)
MCV: 89.8 fL (ref 78.0–100.0)
Platelets: 258 10*3/uL (ref 150–400)
RBC: 4.63 MIL/uL (ref 4.22–5.81)
RDW: 12.9 % (ref 11.5–15.5)
WBC: 12.1 10*3/uL — AB (ref 4.0–10.5)

## 2015-02-23 LAB — CBG MONITORING, ED
GLUCOSE-CAPILLARY: 52 mg/dL — AB (ref 70–99)
Glucose-Capillary: 210 mg/dL — ABNORMAL HIGH (ref 70–99)
Glucose-Capillary: 153 mg/dL — ABNORMAL HIGH (ref 70–99)

## 2015-02-23 LAB — URINE MICROSCOPIC-ADD ON

## 2015-02-23 LAB — ACETAMINOPHEN LEVEL

## 2015-02-23 LAB — SALICYLATE LEVEL: Salicylate Lvl: <4 mg/dL (ref 2.8–30.0)

## 2015-02-23 LAB — ETHANOL

## 2015-02-23 MED ORDER — FAMOTIDINE 20 MG PO TABS
20.0000 mg | ORAL_TABLET | Freq: Two times a day (BID) | ORAL | Status: DC
  Administered 2015-02-23 – 2015-03-02 (×15): 20 mg via ORAL
  Filled 2015-02-23 (×15): qty 1

## 2015-02-23 MED ORDER — ALBUTEROL SULFATE HFA 108 (90 BASE) MCG/ACT IN AERS
2.0000 | INHALATION_SPRAY | Freq: Four times a day (QID) | RESPIRATORY_TRACT | Status: DC
Start: 2015-02-23 — End: 2015-02-26
  Administered 2015-02-24 – 2015-02-25 (×3): 2 via RESPIRATORY_TRACT
  Filled 2015-02-23 (×3): qty 6.7

## 2015-02-23 MED ORDER — TRIAMTERENE-HCTZ 37.5-25 MG PO TABS
0.5000 | ORAL_TABLET | Freq: Every day | ORAL | Status: DC
  Administered 2015-02-23 – 2015-03-02 (×8): 0.5 via ORAL
  Filled 2015-02-23 (×10): qty 0.5

## 2015-02-23 MED ORDER — INSULIN ASPART 100 UNIT/ML ~~LOC~~ SOLN
0.0000 [IU] | SUBCUTANEOUS | Status: DC
  Administered 2015-02-23: 24 [IU] via SUBCUTANEOUS
  Administered 2015-02-24: 12 [IU] via SUBCUTANEOUS
  Filled 2015-02-23 (×2): qty 1

## 2015-02-23 MED ORDER — SIMVASTATIN 20 MG PO TABS
20.0000 mg | ORAL_TABLET | Freq: Every evening | ORAL | Status: DC
  Administered 2015-02-23 – 2015-02-28 (×6): 20 mg via ORAL
  Filled 2015-02-23 (×9): qty 1

## 2015-02-23 MED ORDER — SERTRALINE HCL 50 MG PO TABS
25.0000 mg | ORAL_TABLET | Freq: Every day | ORAL | Status: DC
  Administered 2015-02-23 – 2015-03-02 (×8): 25 mg via ORAL
  Filled 2015-02-23 (×8): qty 1

## 2015-02-23 MED ORDER — NICOTINE 21 MG/24HR TD PT24
21.0000 mg | MEDICATED_PATCH | Freq: Once | TRANSDERMAL | Status: AC
  Administered 2015-02-23: 21 mg via TRANSDERMAL
  Filled 2015-02-23: qty 1

## 2015-02-23 MED ORDER — DEXTROSE 50 % IV SOLN
1.0000 | Freq: Once | INTRAVENOUS | Status: AC
  Administered 2015-02-23: 50 mL via INTRAVENOUS
  Filled 2015-02-23: qty 50

## 2015-02-23 MED ORDER — SODIUM CHLORIDE 0.9 % IV BOLUS (SEPSIS)
1000.0000 mL | Freq: Once | INTRAVENOUS | Status: AC
  Administered 2015-02-23: 1000 mL via INTRAVENOUS

## 2015-02-23 MED ORDER — METFORMIN HCL 500 MG PO TABS
500.0000 mg | ORAL_TABLET | Freq: Every day | ORAL | Status: DC
  Filled 2015-02-23 (×2): qty 1

## 2015-02-23 MED ORDER — AMLODIPINE BESYLATE 5 MG PO TABS
5.0000 mg | ORAL_TABLET | Freq: Every day | ORAL | Status: DC
  Administered 2015-02-23 – 2015-03-02 (×8): 5 mg via ORAL
  Filled 2015-02-23 (×9): qty 1

## 2015-02-23 MED ORDER — TRAZODONE HCL 50 MG PO TABS
50.0000 mg | ORAL_TABLET | Freq: Every day | ORAL | Status: DC
  Administered 2015-02-23 – 2015-02-28 (×6): 50 mg via ORAL
  Filled 2015-02-23 (×6): qty 1

## 2015-02-23 MED ORDER — SODIUM CHLORIDE 0.9 % IV SOLN
INTRAVENOUS | Status: AC
  Administered 2015-02-23: 21:00:00 via INTRAVENOUS

## 2015-02-23 NOTE — ED Notes (Signed)
Pt to CT

## 2015-02-23 NOTE — Progress Notes (Signed)
CPS filed APS report with [place] Early of DSS.   Willette Brace [phone] ED CSW 02/23/2015 11:24 PM

## 2015-02-23 NOTE — ED Notes (Signed)
Will not administer night time insulin due to drastic drop after last insulin administration, pt not consuming very much PO, will continue to monitor.

## 2015-02-23 NOTE — Progress Notes (Addendum)
CSW attempted to speak with the pt multiple times. He was not communicative. Patient would not inform CSW where he is currently staying. It appears that pt may be homeless. CSW will notify APS of patient. Patient was able to confirm that that he does not have a support system.  CSW reached out to the Emergency Contact in pt's Facesheet to obtain collateral. The contact listed as [phone] is [place], ALF. CSW spoke with representative Ida from the facility, who states that the pt has not been a resident of theirs for a couple months. She states while at the facility the pt was alert and ambulating well. She confirms that it did not appear as if the pt had a support system while he was living at the facility.  The pt is currently under IVC. The pt will be evaluated by TTS.  Willette Brace [phone] ED CSW 02/23/2015 9:14 PM

## 2015-02-23 NOTE — ED Notes (Signed)
Pt only took a few bites of sandwich, given peanut butter crackers instead and encouraged to eat

## 2015-02-23 NOTE — Progress Notes (Signed)
CSW received voicemail from [place] asking for a call back.   CSW reached back out to [employer]. However, she was not able to get in touch with him.  CSW will follow up with officer in the morning.  Willette Brace [phone] ED CSW 02/23/2015 11:30 PM

## 2015-02-23 NOTE — ED Notes (Signed)
Social work at bedside.  

## 2015-02-23 NOTE — ED Notes (Signed)
Pt care assumed, obtained verbal report.  Pt resting.  Kim case mgr and Britanny SW at bedside to speak to pt.

## 2015-02-23 NOTE — ED Notes (Signed)
CBG 210
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
## 2015-02-23 NOTE — Progress Notes (Addendum)
69 yr old male medicare pt who recently was released from jail  Pt not answering 95% questions from CM nor SW. Pt diid say "no" when asked if had support system.  Pt noted being able to hear as evidence of his eyes tracking [facility] staff members outside of his room when they became noisy and his responses of "No" to having a pcp, not knowing pcp name, support system and calling his sister.  Pt noted to grind his teeth. Pt with eyes open during assessment  Pt was encouraged to speak to CM and SW to allow them to assist him in finding a place to go. CM noted [facility]  [phone] Cm called and spoke with Ivin Booty at [phone] and email to Amedeo Kinsman for PT eval and treatment to assist with facility placement  1645 Cm and TCU RN spoke about pt elevated BS & NA - CM spoke with SW about elevated labs

## 2015-02-23 NOTE — ED Provider Notes (Signed)
CSN: [id number]     Arrival date & time 02/23/15  1122 History   First MD Initiated Contact with Patient 02/23/15 1123     Chief Complaint  Patient presents with  . IVC   . Needs Placement      (Consider location/radiation/quality/duration/timing/severity/associated sxs/prior Treatment) HPI Comments: Patient brought to the emergency department under IVC by police. Patient was in jail overnight and then released this morning. Police were unable, however, to find a location for the patient to be released to. Patient is apparently homeless. Police attempted to place him in multiple shelters, but patient has apparently been and from all of the shelters.  At arrival, patient is without complaints. He does not feel homicidal or suicidal. Has no pain or recent illness.   Past Medical History  Diagnosis Date  . COPD (chronic obstructive pulmonary disease)   . Diabetes mellitus without complication   . Chest pain   . Anxiety    History reviewed. No pertinent past surgical history. History reviewed. No pertinent family history. History  Substance Use Topics  . Smoking status: Current Every Day Smoker  . Smokeless tobacco: Not on file  . Alcohol Use: No    Review of Systems  Respiratory: Negative for shortness of breath.   Cardiovascular: Negative for chest pain.  All other systems reviewed and are negative.     Allergies  Review of patient's allergies indicates no known allergies.  Home Medications   Prior to Admission medications   Medication Sig Start Date End Date Taking? Authorizing Provider  albuterol (PROVENTIL HFA;VENTOLIN HFA) 108 (90 BASE) MCG/ACT inhaler Inhale 2 puffs into the lungs 4 (four) times daily.    Historical Provider, MD  Cholecalciferol (VITAMIN D-3) 1000 UNITS CAPS Take 1,000 Units by mouth daily.    Historical Provider, MD  dextromethorphan-guaiFENesin (MUCINEX DM) 30-600 MG per 12 hr tablet Take 1 tablet by mouth every 12 (twelve) hours. Take for 14  days.  Started on 07/17/13    Historical Provider, MD  diphenhydrAMINE (BENADRYL) 25 MG tablet Take 1 tablet (25 mg total) by mouth every 6 (six) hours. 08/13/13   Cleatrice Burke, PA-C  famotidine (PEPCID) 20 MG tablet Take 1 tablet (20 mg total) by mouth 2 (two) times daily. 08/13/13   Cleatrice Burke, PA-C  metFORMIN (GLUCOPHAGE) 500 MG tablet Take 500 mg by mouth daily.    Historical Provider, MD  nitroGLYCERIN (NITROSTAT) 0.4 MG SL tablet Place 0.4 mg under the tongue every 5 (five) minutes as needed for chest pain.    Historical Provider, MD  predniSONE (DELTASONE) 20 MG tablet 3 tabs po day one, then 2 tabs daily x 4 days 08/13/13   Cleatrice Burke, PA-C  predniSONE (STERAPRED UNI-PAK) 10 MG tablet Take 1 tablet (10 mg total) by mouth daily. Day 1: take 6 tabs.  Day 2: 5 tabs  Day 3: 4 tabs  Day 4: 3 tabs  Day 5: 2 tabs  Day 6: 1 tab 07/19/13   Clayton Bibles, PA-C  risperiDONE (RISPERDAL) 2 MG tablet Take 1 mg by mouth at bedtime.    Historical Provider, MD  sertraline (ZOLOFT) 25 MG tablet Take 25 mg by mouth daily.    Historical Provider, MD  simvastatin (ZOCOR) 20 MG tablet Take 20 mg by mouth every evening.    Historical Provider, MD  traZODone (DESYREL) 50 MG tablet Take 50 mg by mouth at bedtime.    Historical Provider, MD  triamterene-hydrochlorothiazide (MAXZIDE-25) 37.5-25 MG per tablet Take 0.5 tablets by  mouth daily.    Historical Provider, MD   BP 165/118 mmHg  Pulse 96  Temp(Src) 98 F (36.7 C) (Oral)  Resp 18  SpO2 97% Physical Exam  Constitutional: He is oriented to person, place, and time. He appears well-developed. No distress.  HENT:  Head: Normocephalic and atraumatic.  Right Ear: Hearing normal.  Left Ear: Hearing normal.  Nose: Nose normal.  Mouth/Throat: Oropharynx is clear and moist and mucous membranes are normal.  Eyes: Conjunctivae and EOM are normal. Pupils are equal, round, and reactive to light.  Neck: Normal range of motion. Neck supple.  Cardiovascular:  Regular rhythm, S1 normal and S2 normal.  Exam reveals no gallop and no friction rub.   No murmur heard. Pulmonary/Chest: Effort normal and breath sounds normal. No respiratory distress. He exhibits no tenderness.  Abdominal: Soft. Normal appearance and bowel sounds are normal. There is no hepatosplenomegaly. There is no tenderness. There is no rebound, no guarding, no tenderness at McBurney's point and negative Murphy's sign. No hernia.  Musculoskeletal: Normal range of motion.  Neurological: He is alert and oriented to person, place, and time. He has normal strength. No cranial nerve deficit or sensory deficit. Coordination normal. GCS eye subscore is 4. GCS verbal subscore is 5. GCS motor subscore is 6.  Skin: Skin is warm, dry and intact. No rash noted. No cyanosis.  Psychiatric: He has a normal mood and affect. His speech is normal and behavior is normal. Thought content normal.  Nursing note and vitals reviewed.   ED Course  Procedures (including critical care time) Labs Review Labs Reviewed  ACETAMINOPHEN LEVEL - Abnormal; Notable for the following:    Acetaminophen (Tylenol), Serum <10 (*)    All other components within normal limits  CBC - Abnormal; Notable for the following:    WBC 12.1 (*)    All other components within normal limits  COMPREHENSIVE METABOLIC PANEL - Abnormal; Notable for the following:    Sodium 148 (*)    Glucose, Bld 393 (*)    BUN 32 (*)    Total Protein 8.7 (*)    AST 13 (*)    ALT 11 (*)    All other components within normal limits  ETHANOL  SALICYLATE LEVEL  URINE RAPID DRUG SCREEN (HOSP PERFORMED)    Imaging Review No results found.   EKG Interpretation None      MDM   Final diagnoses:  None   malnutrition  Homelessness  Hyperglycemia  Patient brought to the emergency department as an involuntary commitment. Patient spent the night in jail because he had nowhere to go. Upon trying to discharge him from jail this morning, police  found that they had nowhere to bring him. They do not feel comfortable releasing him because of his current state of malnourishment and mental status, were unable to get him into a shelter. Patient was brought to the emergency department for further evaluation. Patient is not currently homicidal or suicidal. Workup thus far has been unremarkable other than hyperglycemia. Patient will be placed on an insulin sliding scale. Patient will require further evaluation for possible psychiatric treatment versus social services intervention and placement.    Orpah Greek, MD 02/23/15 1501

## 2015-02-23 NOTE — ED Notes (Signed)
MD at bedside.  [employer]

## 2015-02-23 NOTE — Consult Note (Signed)
Reason for Consult:hyperglycemia Referring Physician: Dr. Lucianne Lei is an 69 y.o. male.  HPI: 69 yo male with dm2, apparently was brought from jail has hyperglycemia.  ED requested consultation on what to do regarding hyperglycemia, and hypertension.  Pt denies any symptoms at this time.   Past Medical History  Diagnosis Date  . COPD (chronic obstructive pulmonary disease)   . Diabetes mellitus without complication   . Chest pain   . Anxiety     History reviewed. No pertinent past surgical history.  Family History  Problem Relation Age of Onset  . Family history unknown: Yes    Social History:  reports that he has been smoking Cigarettes.  He does not have any smokeless tobacco history on file. He reports that he does not drink alcohol or use illicit drugs.  Allergies: No Known Allergies  Medications: I have reviewed the patient's current medications.  Results for orders placed or performed during the hospital encounter of 02/23/15 (from the past 48 hour(s))  Acetaminophen level     Status: Abnormal   Collection Time: 02/23/15 11:55 AM  Result Value Ref Range   Acetaminophen (Tylenol), Serum <10 (L) 10 - 30 ug/mL    Comment:        THERAPEUTIC CONCENTRATIONS VARY SIGNIFICANTLY. A RANGE OF 10-30 ug/mL MAY BE AN EFFECTIVE CONCENTRATION FOR MANY PATIENTS. HOWEVER, SOME ARE BEST TREATED AT CONCENTRATIONS OUTSIDE THIS RANGE. ACETAMINOPHEN CONCENTRATIONS >150 ug/mL AT 4 HOURS AFTER INGESTION AND >50 ug/mL AT 12 HOURS AFTER INGESTION ARE OFTEN ASSOCIATED WITH TOXIC REACTIONS.   CBC     Status: Abnormal   Collection Time: 02/23/15 11:55 AM  Result Value Ref Range   WBC 12.1 (H) 4.0 - 10.5 K/uL   RBC 4.63 4.22 - 5.81 MIL/uL   Hemoglobin 13.4 13.0 - 17.0 g/dL   HCT 41.6 39.0 - 52.0 %   MCV 89.8 78.0 - 100.0 fL   MCH 28.9 26.0 - 34.0 pg   MCHC 32.2 30.0 - 36.0 g/dL   RDW 12.9 11.5 - 15.5 %   Platelets 258 150 - 400 K/uL  Comprehensive metabolic panel      Status: Abnormal   Collection Time: 02/23/15 11:55 AM  Result Value Ref Range   Sodium 148 (H) 135 - 145 mmol/L   Potassium 4.4 3.5 - 5.1 mmol/L   Chloride 106 101 - 111 mmol/L   CO2 28 22 - 32 mmol/L   Glucose, Bld 393 (H) 70 - 99 mg/dL   BUN 32 (H) 6 - 20 mg/dL   Creatinine, Ser 1.10 0.61 - 1.24 mg/dL   Calcium 10.1 8.9 - 10.3 mg/dL   Total Protein 8.7 (H) 6.5 - 8.1 g/dL   Albumin 4.4 3.5 - 5.0 g/dL   AST 13 (L) 15 - 41 U/L   ALT 11 (L) 17 - 63 U/L   Alkaline Phosphatase 81 38 - 126 U/L   Total Bilirubin 0.7 0.3 - 1.2 mg/dL   GFR calc non Af Amer >60 >60 mL/min   GFR calc Af Amer >60 >60 mL/min    Comment: (NOTE) The eGFR has been calculated using the CKD EPI equation. This calculation has not been validated in all clinical situations. eGFR's persistently <90 mL/min signify possible Chronic Kidney Disease.    Anion gap 14 5 - 15  Ethanol (ETOH)     Status: None   Collection Time: 02/23/15 11:55 AM  Result Value Ref Range   Alcohol, Ethyl (B) <5 <5 mg/dL  Comment:        LOWEST DETECTABLE LIMIT FOR SERUM ALCOHOL IS 11 mg/dL FOR MEDICAL PURPOSES ONLY   Salicylate level     Status: None   Collection Time: 02/23/15 11:55 AM  Result Value Ref Range   Salicylate Lvl <2.8 2.8 - 30.0 mg/dL  Basic metabolic panel     Status: Abnormal   Collection Time: 02/23/15  4:47 PM  Result Value Ref Range   Sodium 142 135 - 145 mmol/L   Potassium 4.3 3.5 - 5.1 mmol/L   Chloride 103 101 - 111 mmol/L   CO2 29 22 - 32 mmol/L   Glucose, Bld 496 (H) 70 - 99 mg/dL   BUN 38 (H) 6 - 20 mg/dL   Creatinine, Ser 1.38 (H) 0.61 - 1.24 mg/dL   Calcium 9.5 8.9 - 10.3 mg/dL   GFR calc non Af Amer 51 (L) >60 mL/min   GFR calc Af Amer 59 (L) >60 mL/min    Comment: (NOTE) The eGFR has been calculated using the CKD EPI equation. This calculation has not been validated in all clinical situations. eGFR's persistently <90 mL/min signify possible Chronic Kidney Disease.    Anion gap 10 5 - 15     No results found.  Review of Systems  Unable to perform ROS: patient nonverbal   Blood pressure 143/89, pulse 62, temperature 98.1 F (36.7 C), temperature source Oral, resp. rate 20, SpO2 97 %. Physical Exam  Constitutional: He is oriented to person, place, and time. He appears well-developed and well-nourished.  HENT:  Head: Normocephalic and atraumatic.  Mouth/Throat: No oropharyngeal exudate.  Eyes: Conjunctivae and EOM are normal. Pupils are equal, round, and reactive to light. No scleral icterus.  Neck: Normal range of motion. Neck supple. No JVD present. No tracheal deviation present. No thyromegaly present.  Cardiovascular: Normal rate and regular rhythm.  Exam reveals no gallop and no friction rub.   No murmur heard. Respiratory: Effort normal and breath sounds normal. No respiratory distress. He has no wheezes. He has no rales.  GI: Soft. Bowel sounds are normal. He exhibits no distension. There is no tenderness. There is no rebound and no guarding.  Musculoskeletal: Normal range of motion. He exhibits no edema or tenderness.  Lymphadenopathy:    He has no cervical adenopathy.  Neurological: He is alert and oriented to person, place, and time. He has normal reflexes. He displays normal reflexes. No cranial nerve deficit. He exhibits normal muscle tone. Coordination normal.  Skin: Skin is warm and dry. No rash noted. No erythema. No pallor.  Psychiatric: He has a normal mood and affect. His behavior is normal. Judgment and thought content normal.    Assessment/Plan: Hyperglycemia I would recommend starting glimepiride 60m po qday Continue metformin   Dehydration Please give 2 L of ns iv prior to discharge  Hypertension Start on amlodipine 555mpo qday,  1st dose now  Would not use ace i due to renal insufficiency.   Dispo:  Please have social worker make sure that he has follow up in 1-2 weeks.     KIJani Gravel/12/2014, 7:07 PM

## 2015-02-23 NOTE — ED Provider Notes (Addendum)
  Physical Exam  BP 143/89 mmHg  Pulse 62  Temp(Src) 98.1 F (36.7 C) (Oral)  Resp 20  SpO2 97%  Physical Exam  ED Course  Procedures  MDM CBG increased to 500. Repeat BMP showed CBG 290 and Cr increased to 1.4 despite fluids. Still hypertensive. Consulted medicine to assist with medical management of diabetes and hypertension.   9:44 PM Dr. Maudie Mercury saw patient. Made recommendations. CBG dropped to 50, given D50 and food and went up to 210. Patient had CT head done since he is not answering questions. No acute stroke. Not clear if he has capacity. Will get psych consult in AM to evaluate for capacity. If patient has no capacity, will need placement to nursing home from the ED. If he has capacity, may be able to dc to shelter.   Wandra Arthurs, MD 02/23/15 1831  Wandra Arthurs, MD 02/23/15 [phone]
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
## 2015-02-23 NOTE — ED Notes (Signed)
Pt returned from CT, CBG check on return, noted to be 43, MD notified and amp of D50 ordered, pt lethargic but responds to verbal stimuli

## 2015-02-23 NOTE — ED Notes (Signed)
Pt ambulatory to the BR with steady gait

## 2015-02-23 NOTE — Progress Notes (Addendum)
Dr. Maudie Mercury came to patient's room to evaluate patient.  Per Dr. Maudie Mercury patient stated he did not want to be admitted to the hospital.  Patient's blood pressure elevated with elevated blood sugars.  Patient with pmhx of diabetes.  Patient is IVC'd. Patient is homeless, no pcp noted.  [facility] discussed patient with medical director regarding admission/disposition.  Medical director recommends TTS consult for questionable psychiatric issues/ psych consult for capacity.  If patient does not have capacity will have social work assist in placing patient from ED.  If patient has capacity, will have social work assist patient in finding place to stay and also provide patient with proper outpatient medical/psych follow up.  Discussed patient with EDP who placed TTS consult.  EDCM discussed patient with TTS [place] who reports patient will be seen this evening by TTS and seen in am by psychiatrist.  Discussed patient with EDSW.  No further EDCM needs at this time.

## 2015-02-23 NOTE — ED Notes (Signed)
Pt able to sit up in bed, alert and will answer questions, given orange juice and ham sandwich

## 2015-02-23 NOTE — ED Notes (Signed)
CBG 536.  [place] notified

## 2015-02-24 ENCOUNTER — Emergency Department (HOSPITAL_COMMUNITY): Payer: Medicare Other

## 2015-02-24 DIAGNOSIS — F39 Unspecified mood [affective] disorder: Secondary | ICD-10-CM | POA: Diagnosis not present

## 2015-02-24 DIAGNOSIS — R627 Adult failure to thrive: Secondary | ICD-10-CM | POA: Diagnosis not present

## 2015-02-24 DIAGNOSIS — R739 Hyperglycemia, unspecified: Secondary | ICD-10-CM | POA: Diagnosis not present

## 2015-02-24 DIAGNOSIS — R4189 Other symptoms and signs involving cognitive functions and awareness: Secondary | ICD-10-CM

## 2015-02-24 DIAGNOSIS — Z01818 Encounter for other preprocedural examination: Secondary | ICD-10-CM | POA: Insufficient documentation

## 2015-02-24 LAB — VITAMIN B12: Vitamin B-12: 1581 pg/mL — ABNORMAL HIGH (ref 180–914)

## 2015-02-24 LAB — CBG MONITORING, ED
GLUCOSE-CAPILLARY: 182 mg/dL — AB (ref 70–99)
GLUCOSE-CAPILLARY: 57 mg/dL — AB (ref 70–99)
Glucose-Capillary: 165 mg/dL — ABNORMAL HIGH (ref 70–99)
Glucose-Capillary: 168 mg/dL — ABNORMAL HIGH (ref 70–99)
Glucose-Capillary: 265 mg/dL — ABNORMAL HIGH (ref 70–99)
Glucose-Capillary: 269 mg/dL — ABNORMAL HIGH (ref 70–99)
Glucose-Capillary: 436 mg/dL — ABNORMAL HIGH (ref 70–99)
Glucose-Capillary: 536 mg/dL — ABNORMAL HIGH (ref 70–99)

## 2015-02-24 LAB — FOLATE: FOLATE: 10.5 ng/mL (ref >5.9)

## 2015-02-24 LAB — CBC WITH DIFFERENTIAL/PLATELET
BASOS PCT: 0 % (ref 0–1)
Basophils Absolute: 0 10*3/uL (ref 0.0–0.1)
Eosinophils Absolute: 0.3 10*3/uL (ref 0.0–0.7)
Eosinophils Relative: 2 % (ref 0–5)
HCT: 36.4 % — ABNORMAL LOW (ref 39.0–52.0)
Hemoglobin: 12.1 g/dL — ABNORMAL LOW (ref 13.0–17.0)
Lymphocytes Relative: 14 % (ref 12–46)
Lymphs Abs: 1.6 10*3/uL (ref 0.7–4.0)
MCH: 29.9 pg (ref 26.0–34.0)
MCHC: 33.2 g/dL (ref 30.0–36.0)
MCV: 89.9 fL (ref 78.0–100.0)
Monocytes Absolute: 0.6 10*3/uL (ref 0.1–1.0)
Monocytes Relative: 5 % (ref 3–12)
NEUTROS ABS: 9.2 10*3/uL — AB (ref 1.7–7.7)
NEUTROS PCT: 79 % — AB (ref 43–77)
Platelets: 199 10*3/uL (ref 150–400)
RBC: 4.05 MIL/uL — AB (ref 4.22–5.81)
RDW: 12.9 % (ref 11.5–15.5)
WBC: 11.6 10*3/uL — ABNORMAL HIGH (ref 4.0–10.5)

## 2015-02-24 LAB — BASIC METABOLIC PANEL
ANION GAP: 9 (ref 5–15)
BUN: 26 mg/dL — ABNORMAL HIGH (ref 6–20)
CALCIUM: 9 mg/dL (ref 8.9–10.3)
CO2: 29 mmol/L (ref 22–32)
Chloride: 104 mmol/L (ref 101–111)
Creatinine, Ser: 1.05 mg/dL (ref 0.61–1.24)
GFR calc Af Amer: >60 mL/min (ref >60)
Glucose, Bld: 281 mg/dL — ABNORMAL HIGH (ref 70–99)
Potassium: 4.3 mmol/L (ref 3.5–5.1)
SODIUM: 142 mmol/L (ref 135–145)

## 2015-02-24 LAB — TSH: TSH: 0.533 u[IU]/mL (ref 0.350–4.500)

## 2015-02-24 MED ORDER — INSULIN ASPART 100 UNIT/ML ~~LOC~~ SOLN
0.0000 [IU] | Freq: Three times a day (TID) | SUBCUTANEOUS | Status: DC
  Administered 2015-02-24: 9 [IU] via SUBCUTANEOUS
  Administered 2015-02-25: 18:00:00 via SUBCUTANEOUS
  Administered 2015-02-25: 5 [IU] via SUBCUTANEOUS
  Administered 2015-02-25: 3 [IU] via SUBCUTANEOUS
  Administered 2015-02-26 (×2): 5 [IU] via SUBCUTANEOUS
  Administered 2015-02-26: 3 [IU] via SUBCUTANEOUS
  Administered 2015-02-27: 5 [IU] via SUBCUTANEOUS
  Administered 2015-02-27: 2 [IU] via SUBCUTANEOUS
  Administered 2015-02-27: 5 [IU] via SUBCUTANEOUS
  Administered 2015-02-28: 1 [IU] via SUBCUTANEOUS
  Administered 2015-02-28: 3 [IU] via SUBCUTANEOUS
  Administered 2015-02-28: 7 [IU] via SUBCUTANEOUS
  Administered 2015-03-01: 5 [IU] via SUBCUTANEOUS
  Administered 2015-03-01 – 2015-03-02 (×3): 3 [IU] via SUBCUTANEOUS
  Filled 2015-02-24 (×7): qty 1

## 2015-02-24 MED ORDER — M.V.I. ADULT IV INJ: INTRAVENOUS | Status: DC

## 2015-02-24 MED ORDER — SODIUM CHLORIDE 0.9 % IV SOLN: 1.0000 mg | Freq: Every day | INTRAVENOUS | Status: DC

## 2015-02-24 MED ORDER — THIAMINE HCL 100 MG/ML IJ SOLN
100.0000 mg | INTRAMUSCULAR | Status: DC
  Administered 2015-02-24: 100 mg via INTRAVENOUS
  Filled 2015-02-24: qty 2

## 2015-02-24 MED ORDER — THIAMINE HCL 100 MG/ML IJ SOLN: Freq: Once | INTRAVENOUS | Status: DC

## 2015-02-24 MED ORDER — THIAMINE HCL 100 MG/ML IJ SOLN
INTRAVENOUS | Status: DC
  Administered 2015-02-24: 15:00:00 via INTRAVENOUS
  Filled 2015-02-24 (×2): qty 1000

## 2015-02-24 MED ORDER — METFORMIN HCL 500 MG PO TABS
500.0000 mg | ORAL_TABLET | Freq: Two times a day (BID) | ORAL | Status: DC
  Administered 2015-02-24 – 2015-03-02 (×11): 500 mg via ORAL
  Filled 2015-02-24 (×14): qty 1
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
## 2015-02-24 NOTE — Progress Notes (Signed)
CSW called Edwin [name] of [facility] assisted living facilities for potential placement once psychitraically and medically stable.   At this time pt pending geri psych placement.   Belia Heman, [place] Work  [employer] [phone]
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
## 2015-02-24 NOTE — Progress Notes (Signed)
Per physical therapy patient is recommended for skilled nursing placement. CSW pursuing snf placement for placement once pt is medically and psychiatrically stable. Per psychiatry patient may benefit from placement if patient does not clear. Pt recommended by neuro for IV vitamins. [facility] patient is checking to see if they can accommodate iv vitamins.   Belia Heman, [place] Work  [employer] [phone]
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
## 2015-02-24 NOTE — Progress Notes (Signed)
Physical Therapy Evaluation    02/24/15 1400  PT Visit Information  Last PT Received On 02/24/15  Assistance Needed +2  History of Present Illness "69 y.o. male hx of DM, COPD, anxiety and question of dementia presenting as IVC due to inability to take care of himself. Per chart review, he was recently released from jail and was brought to [facility] ED after refusing to leave the jail. Police attempted to place patient in multiple shelters but he was refused due to prior admission and discharge from those facilities." -per neurology note  Precautions  Precautions [place] expects to be discharged to: Unsure  Additional Comments per chart, just released from jail, shelters not accepting pt, plan at this time is for SNF  Prior Function  Comments unknown, pt able to stand with nursing with increased assist  Communication  Communication Other (comment) (pt mostly nonverbal)  Pain Assessment  Pain Assessment No/denies pain (does not appear in pain)  Cognition  Arousal/Alertness Awake/alert  Behavior During Therapy Flat affect  Overall Cognitive Status Difficult to assess  Difficult to assess due to (pt remains guarded, mostly nonverbal)  Upper Extremity Assessment  Upper Extremity Assessment Generalized weakness (pt may have functional weakness UE and LE )  Lower Extremity Assessment  Lower Extremity Assessment Generalized weakness (pt able to resist bed mobility so at least 4/5 throughout)  Bed Mobility  Overal bed mobility Needs Assistance;+2 for physical assistance  Bed Mobility Supine to Sit;Sit to Supine  Supine to sit +2 for physical assistance;Total assist  Sit to supine +2 for physical assistance;Total assist  General bed mobility comments pt resistant to movement despite encouragement and offering lunch which pt was not eating, guarding and not participating so returned to supine, nursing reports he was able to stand with increased assist earlier  General  Comments  General comments (skin integrity, edema, etc.) cachexic appearance  PT - End of Session  Activity Tolerance Other (comment) (limited by motivation, not participating)  Patient left in bed;with call bell/phone within reach  PT Assessment  PT Therapy Diagnosis  Generalized weakness  PT Recommendation/Assessment Patient needs continued PT services  PT Problem List Decreased strength;Decreased activity tolerance;Decreased mobility;Decreased knowledge of use of DME  PT Plan  PT Frequency (ACUTE ONLY) Min 2X/week  PT Treatment/Interventions (ACUTE ONLY) DME instruction;Gait training;Functional mobility training;Therapeutic activities;Therapeutic exercise;Balance training;Patient/family education  PT Recommendation  Follow Up Recommendations SNF;Supervision/Assistance - 24 hour  PT equipment Other (comment) (unknown at this time)  Individuals Consulted  Consulted and Agree with Results and Recommendations Patient  Acute Rehab PT Goals  PT Goal Formulation With patient  Time For Goal Achievement 03/10/15  Potential to Achieve Goals Fair  PT Time Calculation  PT Start Time (ACUTE ONLY) 1323  PT Stop Time (ACUTE ONLY) 1331  PT Time Calculation (min) (ACUTE ONLY) 8 min  PT G-Codes **NOT FOR INPATIENT CLASS**  Functional Assessment Tool Used clinical judgement   Functional Limitation Mobility: Walking and moving around  Mobility: Walking and Moving Around Current Status (R5188) CM  Mobility: Walking and Moving Around Goal Status (C1660) CK  PT General Charges  $$ ACUTE PT VISIT 1 Procedure  PT Evaluation  $Initial PT Evaluation Tier I 1 Procedure   Carmelia Bake, PT, DPT 02/24/2015 Pager: [phone]

## 2015-02-24 NOTE — ED Notes (Signed)
Pt was incontinent, full linen change, brief placed on patient

## 2015-02-24 NOTE — ED Notes (Signed)
Waited until pt ate meal to look at sliding scale insulin order.  Pt has had episodes of not eating and then dropping cbg with ssi coverage.  Pt cbg post eating meal elevated.  Gave insulin.  Will monitor for continued elevation to cbg and notify MD.

## 2015-02-24 NOTE — BH Assessment (Signed)
[facility] Assessment Progress Note  The following facilities have been contacted to seek placement for this pt, with results as noted:  Beds available, information faxed, decision pending:  [facility]  No beds available, but accepting referrals for future consideration:  [facility]  Left message on voice mail; awaiting call back:  [facility]  At capacity:  [facility] [place], [place] Triage Specialist [phone]

## 2015-02-24 NOTE — ED Notes (Signed)
EKG given to Dr. Zackowski  

## 2015-02-24 NOTE — Consult Note (Signed)
Consult Reason for Consult:altered mental status Referring Physician: Dr Jonna Munro ED  CC: AMS, question of dementia  HPI: Edwin [name] is an 69 y.o. male hx of DM, COPD, anxiety and question of dementia presenting as IVC due to inability to take care of himself. Per chart review, he was recently released from jail and was brought to [facility] ED after refusing to leave the jail. Police attempted to place patient in multiple shelters but he was refused due to prior admission and discharge from those facilities.   Patient is resting in bed, is not responding to questions so no further history able to be obtained.   CT head imaging reviewed, shows extensive small vessel disease and prior infarcts.   Past Medical History  Diagnosis Date  . COPD (chronic obstructive pulmonary disease)   . Diabetes mellitus without complication   . Chest pain   . Anxiety     History reviewed. No pertinent past surgical history.  Family History  Problem Relation Age of Onset  . Family history unknown: Yes    Social History:  reports that he has been smoking Cigarettes.  He does not have any smokeless tobacco history on file. He reports that he does not drink alcohol or use illicit drugs.  No Known Allergies  Medications:  Scheduled: . albuterol  2 puff Inhalation QID  . amLODipine  5 mg Oral Daily  . famotidine  20 mg Oral BID  . insulin aspart  0-9 Units Subcutaneous TID WC  . metFORMIN  500 mg Oral Q breakfast  . nicotine  21 mg Transdermal Once  . sertraline  25 mg Oral Daily  . simvastatin  20 mg Oral QPM  . traZODone  50 mg Oral QHS  . triamterene-hydrochlorothiazide  0.5 tablet Oral Daily     ROS: Out of a complete 14 system review, the patient complains of only the following symptoms, and all other reviewed systems are negative. +confusion  Physical Examination: Filed Vitals:   02/24/15 0648  BP: 140/88  Pulse: 74  Temp:   Resp: 20   Physical Exam  Constitutional: cachectic  appearing Psych: Affect appropriate to situation Eyes: No scleral injection HENT: No OP obstrucion Head: Normocephalic.  Cardiovascular: Normal rate and regular rhythm.  Respiratory: Effort normal and breath sounds normal.  GI: Soft. Bowel sounds are normal. No distension. There is no tenderness.  Skin: WDI  Neurologic Examination Mental Status: In bed, resting. Will state his name and tell me to, "go away" but no further communication. Not following commands. Due to nonparticipation unable to formally test cognitive function Cranial Nerves: unable to formally test due to nonparticipation Eyes appear midline, no nystagmus or gaze deviation. Face symmetric at rest and with activation. Hearing appears intact bilaterally Motor: unable to formally test Sensory: withdrawals briskly to noxious stimuli in all 4 extremities Deep Tendon Reflexes: diminished bilateral LE patellar and achilles, unable to test UE due to patient refusing Plantars: Right: mute   Left: mute Cerebellar: Patient refused testing Gait: patient refused  Laboratory Studies:   Basic Metabolic Panel:  Recent Labs Lab 02/23/15 1155 02/23/15 1647  NA 148* 142  K 4.4 4.3  CL 106 103  CO2 28 29  GLUCOSE 393* 496*  BUN 32* 38*  CREATININE 1.10 1.38*  CALCIUM 10.1 9.5    Liver Function Tests:  Recent Labs Lab 02/23/15 1155  AST 13*  ALT 11*  ALKPHOS 81  BILITOT 0.7  PROT 8.7*  ALBUMIN 4.4   No  results for input(s): LIPASE, AMYLASE in the last 168 hours. No results for input(s): AMMONIA in the last 168 hours.  CBC:  Recent Labs Lab 02/23/15 1155  WBC 12.1*  HGB 13.4  HCT 41.6  MCV 89.8  PLT 258    Cardiac Enzymes: No results for input(s): CKTOTAL, CKMB, CKMBINDEX, TROPONINI in the last 168 hours.  BNP: Invalid input(s): POCBNP  CBG:  Recent Labs Lab 02/24/15 0114 02/24/15 0518 02/24/15 0659 02/24/15 0936 02/24/15 1055  GLUCAP 182* 269* 165* 57* 265*    Microbiology: No  results found for this or any previous visit.  Coagulation Studies: No results for input(s): LABPROT, INR in the last 72 hours.  Urinalysis:  Recent Labs Lab 02/23/15 2140  COLORURINE YELLOW  LABSPEC 1.012  PHURINE 7.5  GLUCOSEU 250*  HGBUR NEGATIVE  BILIRUBINUR NEGATIVE  KETONESUR NEGATIVE  PROTEINUR NEGATIVE  UROBILINOGEN 1.0  NITRITE NEGATIVE  LEUKOCYTESUR TRACE*    Lipid Panel:  No results found for: CHOL, TRIG, HDL, CHOLHDL, VLDL, LDLCALC  HgbA1C: No results found for: HGBA1C  Urine Drug Screen:     Component Value Date/Time   LABOPIA NONE DETECTED 02/23/2015 2140   COCAINSCRNUR NONE DETECTED 02/23/2015 2140   LABBENZ NONE DETECTED 02/23/2015 2140   AMPHETMU NONE DETECTED 02/23/2015 2140   THCU NONE DETECTED 02/23/2015 2140   LABBARB NONE DETECTED 02/23/2015 2140    Alcohol Level:  Recent Labs Lab 02/23/15 1155  ETH <5     Imaging: Dg Chest 1 View  02/24/2015   CLINICAL DATA:  69 year old male, malnutrition and unable to care for himself. Recently released from jail. Initial encounter.  EXAM: CHEST  1 VIEW  COMPARISON:  CT Abdomen and Pelvis 11/26/2013. Chest radiographs 07/19/2013.  FINDINGS: Portable AP semi upright view at 0946 hours. Stable large lung volumes. Normal cardiac size and mediastinal contours. Visualized tracheal air column is within normal limits. Mildly increased interstitial markings throughout both lungs are stable. No pneumothorax, pulmonary edema, pleural effusion or confluent pulmonary opacity. No acute osseous abnormality identified.  IMPRESSION: Chronic hyperinflation.  No acute cardiopulmonary abnormality.   Electronically Signed   By: Genevie Ann M.D.   On: 02/24/2015 10:00   Ct Head Wo Contrast  02/23/2015   CLINICAL DATA:  Altered mental status/ confusion.  Hyperglycemia  EXAM: CT HEAD WITHOUT CONTRAST  TECHNIQUE: Contiguous axial images were obtained from the base of the skull through the vertex without intravenous contrast.  COMPARISON:   None.  FINDINGS: There is moderate diffuse atrophy. There is no intracranial mass, hemorrhage, extra-axial fluid collection, or midline shift. 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 There is a prior infarct in the periphery of the right temporal lobe. There is evidence of a prior infarct in the right frontal -temporal junction. There is widespread small vessel disease throughout the centra semiovale bilaterally. There is evidence of a prior small infarct in the anterior right lentiform nucleus. There is no acute appearing infarct. Bony calvarium appears intact. The mastoid air cells are clear. There is opacification of the right maxillary antrum. There is a small polyp in the medial left maxillary antrum.  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 Prior infarcts as noted above. No intracranial mass, hemorrhage, or acute appearing infarct. Paranasal sinus disease with essentially complete opacification of the visualized right maxillary antrum.   Electronically Signed   By: Lowella Grip III M.D.   On: 02/23/2015 21:03     Assessment/Plan:  69y/o gentleman presented from jail as  an IVC after police unable to find placement and patient incapable of caring for self. Exam limited due to patient nonparticipation but exam notable for cachectic appearing patient with limited verbal output. CT head shows multiple prior infarcts, extensive white matter disease and generalized atrophy. Suspect patient does have an underlying dementia which is likely multifactorial. CT head raises possibility of vascular dementia. History of malnutrition and cachectic appearance raises concern over possible vitamin deficiency induced dementia. Question if there is also an underlying psychiatric disorder.  -suggest IV thiamine, folic acid and multi-vitamin. Transition to PO upon discharge -check B1, B12, folate, TSH, HIV -would potentially benefit from formal neuro-psych cognitive  testing as outpatient -needs psychiatry follow up -SW consult for long term placment  Jim Like, DO [facility] [phone]  If 7pm- 7am, please page neurology on call as listed in [place]. 02/24/2015, 12:33 PM

## 2015-02-24 NOTE — ED Notes (Signed)
Pt awakened to get patient to eat.  Pt is refusing.  PT at bedside to assess for therapy. Pt refusing to get up out of bed.  Unable to assess.

## 2015-02-24 NOTE — Consult Note (Signed)
[place] Psychiatry Consult   Reason for Consult:  UNSPECIFIED MOOD DISORDER Referring Physician:  EDP Patient Identification: Edwin [name] MRN:  [id number] Principal Diagnosis: Mood disorder Diagnosis:   Patient Active Problem List   Diagnosis Date Noted  . Mood disorder [F39] 02/24/2015    Priority: High  . Hyperglycemia [R73.9] 02/23/2015  . Renal insufficiency [N28.9] 02/23/2015  . DM [E11.9] 10/02/2007  . HYPERTENSION [I10] 10/02/2007  . 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 ALLERGIC RHINITIS [J30.9] 10/02/2007  . EMPHYSEMA [J43.8] 10/02/2007  . COPD [J44.9] 10/02/2007  . 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 HEADACHE, CHRONIC [R51] 10/02/2007  . HYPERGLYCEMIA [R73.09] 10/02/2007    Total Time spent with patient: 45 minutes  Subjective:   Edwin [name] is a 69 y.o. male patient admitted with Unspecified mood disorder.  HPI:  AA male, 69 years old was evaluated for unspecified mood disorder.  Patient was dropped off by a Police after from jail.  It is documented that patient went to jail for tress passing.  This morning patient did not want to speak with providers or answer questions.  Patient did not make eye contact with providers and became irritated with more questions.  Patient will need another evaluation when he is willing to speak with providers.  Please see notes from SW, DSS for plan of care and further information.  Patient does not meet the criteria for Gero-psychiatric placement.  HPI Elements:   Location:  Unspecified mood disorder. Quality:  unknown. Severity:  uta. Timing:  Acute. Duration:  UTA. Context:  UTA.  Past Medical History:  Past Medical History  Diagnosis Date  . COPD (chronic obstructive pulmonary disease)   . Diabetes mellitus without complication   . Chest pain   . Anxiety    History reviewed. No pertinent past surgical history. Family History:  Family History  Problem Relation Age of Onset  . Family history  unknown: Yes   Social History:  History  Alcohol Use No     History  Drug Use No    History   Social History  . Marital Status: Single    Spouse Name: N/A  . Number of Children: N/A  . Years of Education: N/A   Social History Main Topics  . Smoking status: Current Every Day Smoker    Types: Cigarettes  . Smokeless tobacco: Not on file  . Alcohol Use: No  . Drug Use: No  . Sexual Activity: Not on file   Other Topics Concern  . None   Social History Narrative   Additional Social History:    Pain Medications: UTA Prescriptions: UTA Over the Counter: UTA History of alcohol / drug use?: No history of alcohol / drug abuse Longest period of sobriety (when/how long): UTA                     Allergies:  No Known Allergies  Labs:  Results for orders placed or performed during the hospital encounter of 02/23/15 (from the past 48 hour(s))  Acetaminophen level     Status: Abnormal   Collection Time: 02/23/15 11:55 AM  Result Value Ref Range   Acetaminophen (Tylenol), Serum <10 (L) 10 - 30 ug/mL    Comment:        THERAPEUTIC CONCENTRATIONS VARY SIGNIFICANTLY. A RANGE OF 10-30 ug/mL MAY BE AN EFFECTIVE CONCENTRATION FOR MANY PATIENTS. HOWEVER, SOME ARE BEST TREATED AT CONCENTRATIONS OUTSIDE THIS RANGE. ACETAMINOPHEN CONCENTRATIONS >150 ug/mL AT 4 HOURS AFTER INGESTION AND >50  ug/mL AT 12 HOURS AFTER INGESTION ARE OFTEN ASSOCIATED WITH TOXIC REACTIONS.   CBC     Status: Abnormal   Collection Time: 02/23/15 11:55 AM  Result Value Ref Range   WBC 12.1 (H) 4.0 - 10.5 K/uL   RBC 4.63 4.22 - 5.81 MIL/uL   Hemoglobin 13.4 13.0 - 17.0 g/dL   HCT 41.6 39.0 - 52.0 %   MCV 89.8 78.0 - 100.0 fL   MCH 28.9 26.0 - 34.0 pg   MCHC 32.2 30.0 - 36.0 g/dL   RDW 12.9 11.5 - 15.5 %   Platelets 258 150 - 400 K/uL  Comprehensive metabolic panel     Status: Abnormal   Collection Time: 02/23/15 11:55 AM  Result Value Ref Range   Sodium 148 (H) 135 - 145 mmol/L    Potassium 4.4 3.5 - 5.1 mmol/L   Chloride 106 101 - 111 mmol/L   CO2 28 22 - 32 mmol/L   Glucose, Bld 393 (H) 70 - 99 mg/dL   BUN 32 (H) 6 - 20 mg/dL   Creatinine, Ser 1.10 0.61 - 1.24 mg/dL   Calcium 10.1 8.9 - 10.3 mg/dL   Total Protein 8.7 (H) 6.5 - 8.1 g/dL   Albumin 4.4 3.5 - 5.0 g/dL   AST 13 (L) 15 - 41 U/L   ALT 11 (L) 17 - 63 U/L   Alkaline Phosphatase 81 38 - 126 U/L   Total Bilirubin 0.7 0.3 - 1.2 mg/dL   GFR calc non Af Amer >60 >60 mL/min   GFR calc Af Amer >60 >60 mL/min    Comment: (NOTE) The eGFR has been calculated using the CKD EPI equation. This calculation has not been validated in all clinical situations. eGFR's persistently <90 mL/min signify possible Chronic Kidney Disease.    Anion gap 14 5 - 15  Ethanol (ETOH)     Status: None   Collection Time: 02/23/15 11:55 AM  Result Value Ref Range   Alcohol, Ethyl (B) <5 <5 mg/dL    Comment:        LOWEST DETECTABLE LIMIT FOR SERUM ALCOHOL IS 11 mg/dL FOR MEDICAL PURPOSES ONLY   Salicylate level     Status: None   Collection Time: 02/23/15 11:55 AM  Result Value Ref Range   Salicylate Lvl <0.3 2.8 - 30.0 mg/dL  CBG monitoring, ED     Status: Abnormal   Collection Time: 02/23/15  4:29 PM  Result Value Ref Range   Glucose-Capillary 536 (H) 70 - 99 mg/dL   Comment 1 Notify RN   Basic metabolic panel     Status: Abnormal   Collection Time: 02/23/15  4:47 PM  Result Value Ref Range   Sodium 142 135 - 145 mmol/L   Potassium 4.3 3.5 - 5.1 mmol/L   Chloride 103 101 - 111 mmol/L   CO2 29 22 - 32 mmol/L   Glucose, Bld 496 (H) 70 - 99 mg/dL   BUN 38 (H) 6 - 20 mg/dL   Creatinine, Ser 1.38 (H) 0.61 - 1.24 mg/dL   Calcium 9.5 8.9 - 10.3 mg/dL   GFR calc non Af Amer 51 (L) >60 mL/min   GFR calc Af Amer 59 (L) >60 mL/min    Comment: (NOTE) The eGFR has been calculated using the CKD EPI equation. This calculation has not been validated in all clinical situations. eGFR's persistently <90 mL/min signify possible  Chronic Kidney Disease.    Anion gap 10 5 - 15  CBG monitoring, ED  Status: Abnormal   Collection Time: 02/23/15  8:43 PM  Result Value Ref Range   Glucose-Capillary 43 (LL) 70 - 99 mg/dL   Comment 1 Document in Chart    Comment 2 Repeat Test   CBG monitoring, ED     Status: Abnormal   Collection Time: 02/23/15  8:45 PM  Result Value Ref Range   Glucose-Capillary 52 (L) 70 - 99 mg/dL  CBG monitoring, ED     Status: Abnormal   Collection Time: 02/23/15  9:20 PM  Result Value Ref Range   Glucose-Capillary 210 (H) 70 - 99 mg/dL   Comment 1 Document in Chart   Urine Drug Screen     Status: None   Collection Time: 02/23/15  9:40 PM  Result Value Ref Range   Opiates NONE DETECTED NONE DETECTED   Cocaine NONE DETECTED NONE DETECTED   Benzodiazepines NONE DETECTED NONE DETECTED   Amphetamines NONE DETECTED NONE DETECTED   Tetrahydrocannabinol NONE DETECTED NONE DETECTED   Barbiturates NONE DETECTED NONE DETECTED    Comment:        DRUG SCREEN FOR MEDICAL PURPOSES ONLY.  IF CONFIRMATION IS NEEDED FOR ANY PURPOSE, NOTIFY LAB WITHIN 5 DAYS.        LOWEST DETECTABLE LIMITS FOR URINE DRUG SCREEN Drug Class       Cutoff (ng/mL) Amphetamine      1000 Barbiturate      200 Benzodiazepine   431 Tricyclics       427 Opiates          300 Cocaine          300 THC              50   Urinalysis, Routine w reflex microscopic     Status: Abnormal   Collection Time: 02/23/15  9:40 PM  Result Value Ref Range   Color, Urine YELLOW YELLOW   APPearance CLEAR CLEAR   Specific Gravity, Urine 1.012 1.005 - 1.030   pH 7.5 5.0 - 8.0   Glucose, UA 250 (A) NEGATIVE mg/dL   Hgb urine dipstick NEGATIVE NEGATIVE   Bilirubin Urine NEGATIVE NEGATIVE   Ketones, ur NEGATIVE NEGATIVE mg/dL   Protein, ur NEGATIVE NEGATIVE mg/dL   Urobilinogen, UA 1.0 0.0 - 1.0 mg/dL   Nitrite NEGATIVE NEGATIVE   Leukocytes, UA TRACE (A) NEGATIVE  Urine microscopic-add on     Status: None   Collection Time: 02/23/15   9:40 PM  Result Value Ref Range   WBC, UA 3-6 <3 WBC/hpf  CBG monitoring, ED     Status: Abnormal   Collection Time: 02/23/15 11:22 PM  Result Value Ref Range   Glucose-Capillary 153 (H) 70 - 99 mg/dL   Comment 1 Document in Chart   CBG monitoring, ED     Status: Abnormal   Collection Time: 02/24/15  1:14 AM  Result Value Ref Range   Glucose-Capillary 182 (H) 70 - 99 mg/dL   Comment 1 Document in Chart   CBG monitoring, ED     Status: Abnormal   Collection Time: 02/24/15  5:18 AM  Result Value Ref Range   Glucose-Capillary 269 (H) 70 - 99 mg/dL   Comment 1 Notify RN    Comment 2 Document in Chart   CBG monitoring, ED     Status: Abnormal   Collection Time: 02/24/15  6:59 AM  Result Value Ref Range   Glucose-Capillary 165 (H) 70 - 99 mg/dL   Comment 1 Notify RN  Comment 2 Document in Chart   CBG monitoring, ED     Status: Abnormal   Collection Time: 02/24/15  9:36 AM  Result Value Ref Range   Glucose-Capillary 57 (L) 70 - 99 mg/dL  CBG monitoring, ED     Status: Abnormal   Collection Time: 02/24/15 10:55 AM  Result Value Ref Range   Glucose-Capillary 265 (H) 70 - 99 mg/dL  CBC with Differential/Platelet     Status: Abnormal   Collection Time: 02/24/15  1:37 PM  Result Value Ref Range   WBC 11.6 (H) 4.0 - 10.5 K/uL   RBC 4.05 (L) 4.22 - 5.81 MIL/uL   Hemoglobin 12.1 (L) 13.0 - 17.0 g/dL   HCT 36.4 (L) 39.0 - 52.0 %   MCV 89.9 78.0 - 100.0 fL   MCH 29.9 26.0 - 34.0 pg   MCHC 33.2 30.0 - 36.0 g/dL   RDW 12.9 11.5 - 15.5 %   Platelets 199 150 - 400 K/uL   Neutrophils Relative % 79 (H) 43 - 77 %   Neutro Abs 9.2 (H) 1.7 - 7.7 K/uL   Lymphocytes Relative 14 12 - 46 %   Lymphs Abs 1.6 0.7 - 4.0 K/uL   Monocytes Relative 5 3 - 12 %   Monocytes Absolute 0.6 0.1 - 1.0 K/uL   Eosinophils Relative 2 0 - 5 %   Eosinophils Absolute 0.3 0.0 - 0.7 K/uL   Basophils Relative 0 0 - 1 %   Basophils Absolute 0.0 0.0 - 0.1 K/uL  Basic metabolic panel     Status: Abnormal    Collection Time: 02/24/15  1:37 PM  Result Value Ref Range   Sodium 142 135 - 145 mmol/L   Potassium 4.3 3.5 - 5.1 mmol/L   Chloride 104 101 - 111 mmol/L   CO2 29 22 - 32 mmol/L   Glucose, Bld 281 (H) 70 - 99 mg/dL   BUN 26 (H) 6 - 20 mg/dL   Creatinine, Ser 1.05 0.61 - 1.24 mg/dL   Calcium 9.0 8.9 - 10.3 mg/dL   GFR calc non Af Amer >60 >60 mL/min   GFR calc Af Amer >60 >60 mL/min    Comment: (NOTE) The eGFR has been calculated using the CKD EPI equation. This calculation has not been validated in all clinical situations. eGFR's persistently <90 mL/min signify possible Chronic Kidney Disease.    Anion gap 9 5 - 15  Vitamin B12     Status: Abnormal   Collection Time: 02/24/15  2:18 PM  Result Value Ref Range   Vitamin B-12 1581 (H) 180 - 914 pg/mL    Comment: (NOTE) This assay is not validated for testing neonatal or myeloproliferative syndrome specimens for Vitamin B12 levels. Performed at [facility]   Folate     Status: None   Collection Time: 02/24/15  2:18 PM  Result Value Ref Range   Folate 10.5 >5.9 ng/mL    Comment: Performed at [facility]  TSH     Status: None   Collection Time: 02/24/15  2:19 PM  Result Value Ref Range   TSH 0.533 0.350 - 4.500 uIU/mL    Vitals: Blood pressure 152/80, pulse 57, temperature 97.7 F (36.5 C), temperature source Oral, resp. rate 18, weight 48.49 kg (106 lb 14.4 oz), SpO2 95 %.  Risk to Self: Suicidal Ideation:  (UTA) Suicidal Intent:  (UTA) Is patient at risk for suicide?:  (UTA) Suicidal Plan?:  (UTA) Access to Means:  (UTA) What has  been your use of drugs/alcohol within the last 12 months?:  (UTA) How many times?:  (UTA) Other Self Harm Risks:  (UTA) Triggers for Past Attempts:  (UTA) Intentional Self Injurious Behavior:  (UTA) Risk to Others: Homicidal Ideation:  (UTA) Thoughts of Harm to Others:  (UTA) Current Homicidal Intent:  (UTA) Current Homicidal Plan:  (UTA) Access to Homicidal Means:   (UTA) Identified Victim:  (UTA) History of harm to others?:  (UTA) Assessment of Violence:  (UTA) Violent Behavior Description:  (UTA) Does patient have access to weapons?:  (UTA) Criminal Charges Pending?:  (UTA) Does patient have a court date:  Special educational needs teacher) Prior Inpatient Therapy: Prior Inpatient Therapy:  (UTA) Prior Outpatient Therapy: Prior Outpatient Therapy:  (UTA) Does patient have an ACCT team?: Unknown Does patient have Intensive In-House Services?  : Unknown Does patient have [facility] services? : Unknown Does patient have P4CC services?: Unknown  Current Facility-Administered Medications  Medication Dose Route Frequency Provider Last Rate Last Dose  . albuterol (PROVENTIL HFA;VENTOLIN HFA) 108 (90 BASE) MCG/ACT inhaler 2 puff  2 puff Inhalation QID Orpah Greek, MD   2 puff at 02/24/15 1047  . amLODipine (NORVASC) tablet 5 mg  5 mg Oral Daily Jani Gravel, MD   5 mg at 02/24/15 1043  . famotidine (PEPCID) tablet 20 mg  20 mg Oral BID Orpah Greek, MD   20 mg at 02/24/15 1043  . insulin aspart (novoLOG) injection 0-9 Units  0-9 Units Subcutaneous TID WC Fredia Sorrow, MD      . metFORMIN (GLUCOPHAGE) tablet 500 mg  500 mg Oral BID WC Costin Karlyne Greenspan, MD      . sertraline (ZOLOFT) tablet 25 mg  25 mg Oral Daily Orpah Greek, MD   25 mg at 02/24/15 1042  . simvastatin (ZOCOR) tablet 20 mg  20 mg Oral QPM Orpah Greek, MD   20 mg at 02/23/15 1849  . sodium chloride 0.9 % 1,000 mL with thiamine 947 mg, folic acid 1 mg, multivitamins adult 10 mL infusion   Intravenous Q24H Drema Dallas, DO 100 mL/hr at 02/24/15 1434    . thiamine (B-1) injection 100 mg  100 mg Intravenous Q24H Drema Dallas, DO   100 mg at 02/24/15 1434  . traZODone (DESYREL) tablet 50 mg  50 mg Oral QHS Orpah Greek, MD   50 mg at 02/23/15 2139  . triamterene-hydrochlorothiazide (MAXZIDE-25) 37.5-25 MG per tablet 0.5 tablet  0.5 tablet Oral Daily Orpah Greek, MD    0.5 tablet at 02/24/15 1043   Current Outpatient Prescriptions  Medication Sig Dispense Refill  . predniSONE (DELTASONE) 20 MG tablet 3 tabs po day one, then 2 tabs daily x 4 days (Patient not taking: Reported on 02/23/2015) 11 tablet 0  . predniSONE (STERAPRED UNI-PAK) 10 MG tablet Take 1 tablet (10 mg total) by mouth daily. Day 1: take 6 tabs.  Day 2: 5 tabs  Day 3: 4 tabs  Day 4: 3 tabs  Day 5: 2 tabs  Day 6: 1 tab (Patient not taking: Reported on 02/23/2015) 21 tablet 0    Unable to perform review of system due to patient refusal to participate in assessment, non verbal.  Musculoskeletal: Strength & Muscle Tone: Was lying down in bed Gait & Station: lying down in bed Patient leans: seen in bed  Psychiatric Specialty Exam:     Blood pressure 152/80, pulse 57, temperature 97.7 F (36.5 C), temperature source Oral, resp. rate 18, weight 48.49  kg (106 lb 14.4 oz), SpO2 95 %.There is no height on file to calculate BMI.  General Appearance: Casual and Disheveled  Eye Contact::  None  Speech:  Slow and "Used I don't know"  Volume:  Decreased  Mood:  Angry, Depressed and Dysphoric  Affect:  Congruent  Thought Process:  uta  Orientation:  Other:  uta  Thought Content:  uta  Suicidal Thoughts:  uta  Homicidal Thoughts:  uta  Memory:  uta  Judgement:  Other:  uta  Insight:  uta  Psychomotor Activity:  uta  Concentration:  uta  Recall:  uta  Fund of Knowledge:uta  Language: uta  Akathisia:  NA  Handed:  Right  AIMS (if indicated):     Assets:  Others:  uta  ADL's:  Impaired  Cognition: Impaired,  Moderate  Sleep:      Medical Decision Making: Review of Psycho-Social Stressors (1), Established Problem, Worsening (2), Review of Medication Regimen & Side Effects (2) and Review of New Medication or Change in Dosage (2)  Treatment Plan Summary: Daily contact with patient to assess and evaluate symptoms and progress in treatment, Medication management and Plan Disposition to be  determined later since SW is involved.  We have started Trazodone 50 mg qhs for sleep  and Sertraline 25 mg bo daily for depression.  Plan:  Recommend psychiatric Inpatient admission when medically cleared. Disposition: see above  Delfin Gant   PMHNP-BC 02/24/2015 6:37 PM Patient seen face-to-face for psychiatric evaluation, chart reviewed and case discussed with the physician extender and developed treatment plan. Reviewed the information documented and agree with the treatment plan. Corena Pilgrim, MD

## 2015-02-24 NOTE — Progress Notes (Signed)
CSW met with Development worker, international aid of [place]/Tori in [place] conference room. She informed CSW that she will review pt. However, she states that the pt will have to have a special assistance medicaid in order to be possibly admitted.  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  Willette Brace [phone] ED CSW 02/24/2015 11:27 PM

## 2015-02-24 NOTE — Clinical Social Work Note (Signed)
Clinical Social Work Assessment  Patient Details  Name: Edwin [name] MRN: [id number] Date of Birth: 1946/07/07  Date of referral:  02/23/15               Reason for consult:  Housing Concerns/Homelessness, Nutritional therapist, Abuse/Neglect, Guardianship Needs                Permission sought to share information with:    Permission granted to share information::  No  Name::        Agency::     Relationship::     Contact Information:     Housing/Transportation Living arrangements for the past 2 months:  Homeless Source of Information:  Patient, Event organiser Patient Interpreter Needed:  None Criminal Activity/Legal Involvement Pertinent to Current Situation/Hospitalization:  No - Comment as needed Significant Relationships:  None Lives with:  Self Do you feel safe going back to the place where you live?  No Need for family participation in patient care:  Yes (Comment)  Care giving concerns:  Strong concern regarding patient ability to care for self. Pt appears very malnourished, and has not being staying anywhere per patient. Patient only alert to self. Pt does not respond at times, and stares blankly. Pt sometimes answer questions however is not able to provide much detailed answers. Pt responds mostly with I dont know.    Social Worker assessment / plan:  CSW met with pt to complete psychosocial assessment. Pt states he doesn't remember where he use to stay because it has been so long ago. Pt states he has not been staying anywhere.  Pt states he does not have any family and no one to call. Pt was eating and did not want to stop eating to talk and this writer was having a hard time understanding patient on 02/23/2015 to complete full assessment.   This morning, patient only alert to self. Pt states, "I'm in a hospital somewhere."  When asked where patient grew up, he said, "at home" When asked what is todays date, Patient repilies, "I dont know." When asked where would you like to  go when you live the hospital, "patient states he doesn't have a place in mind." When csw asked about assisted living patient replied, "I dont know." When csw inquired about needing assistance, patient states I can bathe myself. CSW asked patient about his medications, patient replied, "I don't like any of them." CSW inquired about which type of medications he doesn't like patient states, "All of them." CSW asked patient if he had any mental illness, Patient replied," I don't know."  Pt asked if he would like to go to a group home, "I don't want anything to do with a group home."    Pt to be evaluated by psychiatrist for possible mental health concerns.  APS report made to assist with patient care.    Employment status:  Unemployed Forensic scientist:  Medicare PT Recommendations:    Information / Referral to community resources:  APS (Comment Required: [place], Name & Number of worker spoken with), Shelter  Patient/Family's Response to care:  Patient is very ambivalent. When csw asked patient about discharge plans, patient states, "I dont know, I dont' care." When asked about a group home patient adaamant about not wanting group home placement.  Patient possibly open to assisted living placement however states that he can do all of his things.    Patient/Family's Understanding of and Emotional Response to Diagnosis, Current Treatment, and Prognosis: Pt understanding of current  situatioin and prognosis is unknown at this time.   Emotional Assessment Appearance:  Appears older than stated age, Disheveled, Malodorous Attitude/Demeanor/Rapport:  Inconsistent, Unresponsive, Guarded Affect (typically observed):  Calm Orientation:  Oriented to Self Alcohol / Substance use:    Psych involvement (Current and /or in the community):     Discharge Needs  Concerns to be addressed:  Homelessness, Lack of Support, Decision making concerns Readmission within the last 30 days:  No Current discharge  risk:  None Barriers to Discharge:  Other (aps, location of approrpraite placement)   Anagabriela Jokerst A, LCSW 02/24/2015, 10:09 AM

## 2015-02-24 NOTE — Progress Notes (Addendum)
CSW, RN CM spoke with officer who petitioned patietn udner IVC. Per officer, patient "signed self out" per [place]. [place] in march and went to the homeless shelter. Pt defecated on self and refused to get out of bed and was arrested for trespasing and went to jail, refused to leave, was arrested again for trespassing, and then released from jail again when officer petitioned patient under IVC due to patient not being abel to answer questions approrpriately. Pt sister Narayan, [place] was located and contacted at [phone]. Pt sister stated that patietn has been a resident at [facility] for years and was unaware that patient had left. Per Ms. Smith at [facility] patietn is own guardian and signed himself out in march. Ms. Tamala Julian stated that they called and left a message for pt family regarding pt discharge. Patient family stated that no one from [place] had contacted them and that patient does have a history of dementia. Patietn sister IT consultant. [place].   APS report has been assigned to [facility] [phone]. CSW left message to provide further rinformation.   Per chart review, pt was seen by PA, Clayton Bibles on 07/19/2013. Below is documentation from that visit of patient baseline being only alert to self, and lack of knowledge of where he lives or his medicals problems is baseline.   9/27/201411:18 AM I spoke with "Tisha" - staff member at [facility]. Judie Petit states patient was complaining of chest pain, cold, flu-like symptoms since last week. Was seen by Doctor 4 days ago who prescribed mucinex without any improvement. Judie Petit states that patient's orientation only to self and lack of knowledge of where he lives or his medical problems is baseline for him, that he has been like that since April when he came to live there. Pt has not formally been diagnosed with dementia.   08/13/2013 Medication List  Patient was listed to have home medications including:   albuterol (PROVENTIL HFA;VENTOLIN HFA) 108 (90  BASE) MCG/ACT inhaler   Inhalation   Inhale 2 puffs into the lungs 4 (four) times daily.         Marland Kitchen dextromethorphan-guaiFENesin (MUCINEX DM) 30-600 MG per 12 hr tablet   Oral   Take 1 tablet by mouth every 12 (twelve) hours. Take for 14 days. Started on 07/17/13         . metFORMIN (GLUCOPHAGE) 500 MG tablet   Oral   Take 500 mg by mouth daily.         . nitroGLYCERIN (NITROSTAT) 0.4 MG SL tablet   Sublingual   Place 0.4 mg under the tongue every 5 (five) minutes as needed for chest pain.         . predniSONE (STERAPRED UNI-PAK) 10 MG tablet   Oral   Take 1 tablet (10 mg total) by mouth daily. Day 1: take 6 tabs. Day 2: 5 tabs Day 3: 4 tabs Day 4: 3 tabs Day 5: 2 tabs Day 6: 1 tab   21 tablet   0   . risperiDONE (RISPERDAL) 2 MG tablet   Oral   Take 1 mg by mouth at bedtime.         . sertraline (ZOLOFT) 25 MG tablet   Oral   Take 25 mg by mouth daily.         . simvastatin (ZOCOR) 20 MG tablet   Oral   Take 20 mg by mouth every evening.         . traZODone (DESYREL) 50 MG tablet  Oral   Take 50 mg by mouth at bedtime.         . triamterene-hydrochlorothiazide (MAXZIDE-25) 37.5-25 MG per tablet   Oral   Take 0.5 tablets by mouth daily.

## 2015-02-24 NOTE — ED Notes (Signed)
TTS came to room to speak with patient.  Pt awakened with light.  Pt eyes are open and looking at staff.  Pt refusing to answer any questions with TTS.  Pt posturing appears wnl.  No new neuro symptoms noted by positioning or eye contact.  Unable to fully assess.  ? Behavioral.  Leaving light on to monitor.  Vitals and CBG stable.  Pending psych eval when MD arrives.

## 2015-02-24 NOTE — ED Provider Notes (Addendum)
Patient most likely homeless without any sheltering. Patient may have dementia file UA did by psychiatry and not felt to be purely behavioral health. They have requested that the neural hospitalist give a consult on the patient. Patient is cachectic as diabetes is not well controlled but also not eating very well. Patient certainly needs nursing home placement. They are trying to get him to [facility].  Fredia Sorrow, MD 02/24/15 1243  In review of the patient today there was concerns of for really failure to thrive. Patient very thin sleeping in fetal position not wanting to eat. Originally neural hospitalist was contacted in order for consult relating to dementia to help try to get the patient to [facility]. [place] felt the patient was not purely behavioral or psychiatric in nature. The neural hospitalist recommended admission and tuning up of his blood sugar concerns which have been low and high getting him eating more regular checking for any vitamin deficiencies. Recommend a receive vitamin supplements for 3 days. Based on this hospitalist was recontacted for pot consideration for admission. They did come see the patient that it was determined he did not really meet criteria. Patient now is receiving IV vitamin supplements and will then receive oral supplements and they are working on it either nursing home placement or placement of [place].  Patient this afternoon appears to be doing better is more alert still very unsteady on his feet with walking.   Review of patient's labs without any significant abnormalities. And show evidence of improvement over the last 24 hours.   Results for orders placed or performed during the hospital encounter of 02/23/15  Acetaminophen level  Result Value Ref Range   Acetaminophen (Tylenol), Serum <10 (L) 10 - 30 ug/mL  CBC  Result Value Ref Range   WBC 12.1 (H) 4.0 - 10.5 K/uL   RBC 4.63 4.22 - 5.81 MIL/uL   Hemoglobin 13.4 13.0 - 17.0  g/dL   HCT 41.6 39.0 - 52.0 %   MCV 89.8 78.0 - 100.0 fL   MCH 28.9 26.0 - 34.0 pg   MCHC 32.2 30.0 - 36.0 g/dL   RDW 12.9 11.5 - 15.5 %   Platelets 258 150 - 400 K/uL  Comprehensive metabolic panel  Result Value Ref Range   Sodium 148 (H) 135 - 145 mmol/L   Potassium 4.4 3.5 - 5.1 mmol/L   Chloride 106 101 - 111 mmol/L   CO2 28 22 - 32 mmol/L   Glucose, Bld 393 (H) 70 - 99 mg/dL   BUN 32 (H) 6 - 20 mg/dL   Creatinine, Ser 1.10 0.61 - 1.24 mg/dL   Calcium 10.1 8.9 - 10.3 mg/dL   Total Protein 8.7 (H) 6.5 - 8.1 g/dL   Albumin 4.4 3.5 - 5.0 g/dL   AST 13 (L) 15 - 41 U/L   ALT 11 (L) 17 - 63 U/L   Alkaline Phosphatase 81 38 - 126 U/L   Total Bilirubin 0.7 0.3 - 1.2 mg/dL   GFR calc non Af Amer >60 >60 mL/min   GFR calc Af Amer >60 >60 mL/min   Anion gap 14 5 - 15  Ethanol (ETOH)  Result Value Ref Range   Alcohol, Ethyl (B) <5 <5 mg/dL  Salicylate level  Result Value Ref Range   Salicylate Lvl <8.5 2.8 - 30.0 mg/dL  Urine Drug Screen  Result Value Ref Range   Opiates NONE DETECTED NONE DETECTED   Cocaine NONE DETECTED NONE DETECTED   Benzodiazepines NONE DETECTED NONE  DETECTED   Amphetamines NONE DETECTED NONE DETECTED   Tetrahydrocannabinol NONE DETECTED NONE DETECTED   Barbiturates NONE DETECTED NONE DETECTED  Urinalysis, Routine w reflex microscopic  Result Value Ref Range   Color, Urine YELLOW YELLOW   APPearance CLEAR CLEAR   Specific Gravity, Urine 1.012 1.005 - 1.030   pH 7.5 5.0 - 8.0   Glucose, UA 250 (A) NEGATIVE mg/dL   Hgb urine dipstick NEGATIVE NEGATIVE   Bilirubin Urine NEGATIVE NEGATIVE   Ketones, ur NEGATIVE NEGATIVE mg/dL   Protein, ur NEGATIVE NEGATIVE mg/dL   Urobilinogen, UA 1.0 0.0 - 1.0 mg/dL   Nitrite NEGATIVE NEGATIVE   Leukocytes, UA TRACE (A) NEGATIVE  Basic metabolic panel  Result Value Ref Range   Sodium 142 135 - 145 mmol/L   Potassium 4.3 3.5 - 5.1 mmol/L   Chloride 103 101 - 111 mmol/L   CO2 29 22 - 32 mmol/L   Glucose, Bld 496  (H) 70 - 99 mg/dL   BUN 38 (H) 6 - 20 mg/dL   Creatinine, Ser 1.38 (H) 0.61 - 1.24 mg/dL   Calcium 9.5 8.9 - 10.3 mg/dL   GFR calc non Af Amer 51 (L) >60 mL/min   GFR calc Af Amer 59 (L) >60 mL/min   Anion gap 10 5 - 15  Urine microscopic-add on  Result Value Ref Range   WBC, UA 3-6 <3 WBC/hpf  CBC with Differential/Platelet  Result Value Ref Range   WBC 11.6 (H) 4.0 - 10.5 K/uL   RBC 4.05 (L) 4.22 - 5.81 MIL/uL   Hemoglobin 12.1 (L) 13.0 - 17.0 g/dL   HCT 36.4 (L) 39.0 - 52.0 %   MCV 89.9 78.0 - 100.0 fL   MCH 29.9 26.0 - 34.0 pg   MCHC 33.2 30.0 - 36.0 g/dL   RDW 12.9 11.5 - 15.5 %   Platelets 199 150 - 400 K/uL   Neutrophils Relative % 79 (H) 43 - 77 %   Neutro Abs 9.2 (H) 1.7 - 7.7 K/uL   Lymphocytes Relative 14 12 - 46 %   Lymphs Abs 1.6 0.7 - 4.0 K/uL   Monocytes Relative 5 3 - 12 %   Monocytes Absolute 0.6 0.1 - 1.0 K/uL   Eosinophils Relative 2 0 - 5 %   Eosinophils Absolute 0.3 0.0 - 0.7 K/uL   Basophils Relative 0 0 - 1 %   Basophils Absolute 0.0 0.0 - 0.1 K/uL  Basic metabolic panel  Result Value Ref Range   Sodium 142 135 - 145 mmol/L   Potassium 4.3 3.5 - 5.1 mmol/L   Chloride 104 101 - 111 mmol/L   CO2 29 22 - 32 mmol/L   Glucose, Bld 281 (H) 70 - 99 mg/dL   BUN 26 (H) 6 - 20 mg/dL   Creatinine, Ser 1.05 0.61 - 1.24 mg/dL   Calcium 9.0 8.9 - 10.3 mg/dL   GFR calc non Af Amer >60 >60 mL/min   GFR calc Af Amer >60 >60 mL/min   Anion gap 9 5 - 15  TSH  Result Value Ref Range   TSH 0.533 0.350 - 4.500 uIU/mL  CBG monitoring, ED  Result Value Ref Range   Glucose-Capillary 43 (LL) 70 - 99 mg/dL   Comment 1 Document in Chart    Comment 2 Repeat Test   CBG monitoring, ED  Result Value Ref Range   Glucose-Capillary 52 (L) 70 - 99 mg/dL  CBG monitoring, ED  Result Value Ref Range   Glucose-Capillary  210 (H) 70 - 99 mg/dL   Comment 1 Document in Chart   CBG monitoring, ED  Result Value Ref Range   Glucose-Capillary 153 (H) 70 - 99 mg/dL   Comment 1  Document in Chart   CBG monitoring, ED  Result Value Ref Range   Glucose-Capillary 182 (H) 70 - 99 mg/dL   Comment 1 Document in Chart   CBG monitoring, ED  Result Value Ref Range   Glucose-Capillary 536 (H) 70 - 99 mg/dL   Comment 1 Notify RN   CBG monitoring, ED  Result Value Ref Range   Glucose-Capillary 269 (H) 70 - 99 mg/dL   Comment 1 Notify RN    Comment 2 Document in Chart   CBG monitoring, ED  Result Value Ref Range   Glucose-Capillary 165 (H) 70 - 99 mg/dL   Comment 1 Notify RN    Comment 2 Document in Chart   CBG monitoring, ED  Result Value Ref Range   Glucose-Capillary 57 (L) 70 - 99 mg/dL  CBG monitoring, ED  Result Value Ref Range   Glucose-Capillary 265 (H) 70 - 99 mg/dL     Fredia Sorrow, MD 02/24/15 1623

## 2015-02-24 NOTE — BHH Counselor (Signed)
Pt was not been responsive/verbal to any TTS staff members or to the [place] when attempting assessment. Therefore, TTS counselor Vicente Males) asked current EDP (Dr Lita Mains) if he could remove the TTS consult until the pt is more verbal, alert, and responsive. EDP was assured that psychiatry will make rounds in the morning to assess pt regardless.   Ramond Dial, MS, [facility] Triage Specialist  Ext [phone]

## 2015-02-25 DIAGNOSIS — R627 Adult failure to thrive: Secondary | ICD-10-CM | POA: Diagnosis not present

## 2015-02-25 DIAGNOSIS — F39 Unspecified mood [affective] disorder: Secondary | ICD-10-CM | POA: Diagnosis not present

## 2015-02-25 LAB — CBG MONITORING, ED
GLUCOSE-CAPILLARY: 250 mg/dL — AB (ref 70–99)
Glucose-Capillary: 43 mg/dL — CL (ref 70–99)
Glucose-Capillary: 239 mg/dL — ABNORMAL HIGH (ref 70–99)
Glucose-Capillary: 256 mg/dL — ABNORMAL HIGH (ref 70–99)
Glucose-Capillary: 203 mg/dL — ABNORMAL HIGH (ref 70–99)

## 2015-02-25 LAB — HIV ANTIBODY (ROUTINE TESTING W REFLEX): HIV Screen 4th Generation wRfx: NONREACTIVE

## 2015-02-25 LAB — VITAMIN B1: Vitamin B1 (Thiamine): 118.7 nmol/L (ref 66.5–200.0)

## 2015-02-25 NOTE — ED Notes (Addendum)
Tech at bedside feeding patient, he has completed most of meal tray, insulin given

## 2015-02-25 NOTE — ED Notes (Signed)
Pt breakfast at bedside, pt refusing to sit up and eat at this time, will hold meal coverage until patient eats. Pt also refusing morning medication at this time. Will continue to encourage patient to eat and try again to get him to take medications.

## 2015-02-25 NOTE — ED Notes (Signed)
Urinal used.

## 2015-02-25 NOTE — ED Notes (Signed)
Pt continues to refuse medications and to eat. Will continue to monitor.

## 2015-02-25 NOTE — ED Notes (Signed)
Assisted to side of bed and encouraged to eat independantly.

## 2015-02-25 NOTE — Progress Notes (Addendum)
Per sister pt is a [place] with [facility] - getting PT services for pt in afternoons after 1 pm when he seems more alert  PT paged [phone] Spoke with Belenda Cruise to see if pt could be seen after 1 pm Belenda Cruise will pass along to 02/26/15 PT

## 2015-02-25 NOTE — Consult Note (Signed)
[place] Psychiatry Consult   Reason for Consult:  UNSPECIFIED MOOD DISORDER Referring Physician:  EDP Patient Identification: Edwin [name] MRN:  [id number] Principal Diagnosis: Mood disorder Diagnosis:   Patient Active Problem List   Diagnosis Date Noted  . Mood disorder [F39] 02/24/2015    Priority: High  . Encounter for preadmission testing [Z01.818]   . Hyperglycemia [R73.9] 02/23/2015  . Renal insufficiency [N28.9] 02/23/2015  . DM [E11.9] 10/02/2007  . HYPERTENSION [I10] 10/02/2007  . 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 ALLERGIC RHINITIS [J30.9] 10/02/2007  . EMPHYSEMA [J43.8] 10/02/2007  . COPD [J44.9] 10/02/2007  . 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 HEADACHE, CHRONIC [R51] 10/02/2007  . HYPERGLYCEMIA [R73.09] 10/02/2007    Total Time spent with patient: 45 minutes  Subjective:   Edwin [name] is a 69 y.o. male patient admitted with Unspecified mood disorder.  HPI:  AA male, 69 years old was evaluated for unspecified mood disorder.  Patient was dropped off by a Police after from jail.  It is documented that patient went to jail for tress passing.  This morning patient did not want to speak with providers or answer questions.  Patient did not make eye contact with providers and became irritated with more questions.  Patient will need another evaluation when he is willing to speak with providers.  Please see notes from SW, DSS for plan of care and further information.  Patient does not meet the criteria for Gero-psychiatric placement.  02/25/2015:  Patient was seen in his room this morning.  He did not answer any questions from Psychiatrist.  He was being fed his break fast.  Patient is cooperating with SW and seems to be more awake in the evenings.  The plan is to continue Psychiatric care and medications while the SW is considering Nursing home placement or assisted living facility.  HPI Elements:   Location:  Unspecified mood disorder. Quality:   unknown. Severity:  uta. Timing:  Acute. Duration:  UTA. Context:  UTA.  Past Medical History:  Past Medical History  Diagnosis Date  . COPD (chronic obstructive pulmonary disease)   . Diabetes mellitus without complication   . Chest pain   . Anxiety    History reviewed. No pertinent past surgical history. Family History:  Family History  Problem Relation Age of Onset  . Family history unknown: Yes   Social History:  History  Alcohol Use No     History  Drug Use No    History   Social History  . Marital Status: Single    Spouse Name: N/A  . Number of Children: N/A  . Years of Education: N/A   Social History Main Topics  . Smoking status: Current Every Day Smoker    Types: Cigarettes  . Smokeless tobacco: Not on file  . Alcohol Use: No  . Drug Use: No  . Sexual Activity: Not on file   Other Topics Concern  . None   Social History Narrative   Additional Social History:    Pain Medications: UTA Prescriptions: UTA Over the Counter: UTA History of alcohol / drug use?: No history of alcohol / drug abuse Longest period of sobriety (when/how long): UTA                     Allergies:  No Known Allergies  Labs:  Results for orders placed or performed during the hospital encounter of 02/23/15 (from the past 48 hour(s))  CBG monitoring, ED  Status: Abnormal   Collection Time: 02/23/15  4:29 PM  Result Value Ref Range   Glucose-Capillary 536 (H) 70 - 99 mg/dL   Comment 1 Notify RN   Basic metabolic panel     Status: Abnormal   Collection Time: 02/23/15  4:47 PM  Result Value Ref Range   Sodium 142 135 - 145 mmol/L   Potassium 4.3 3.5 - 5.1 mmol/L   Chloride 103 101 - 111 mmol/L   CO2 29 22 - 32 mmol/L   Glucose, Bld 496 (H) 70 - 99 mg/dL   BUN 38 (H) 6 - 20 mg/dL   Creatinine, Ser 1.38 (H) 0.61 - 1.24 mg/dL   Calcium 9.5 8.9 - 10.3 mg/dL   GFR calc non Af Amer 51 (L) >60 mL/min   GFR calc Af Amer 59 (L) >60 mL/min    Comment: (NOTE) The  eGFR has been calculated using the CKD EPI equation. This calculation has not been validated in all clinical situations. eGFR's persistently <90 mL/min signify possible Chronic Kidney Disease.    Anion gap 10 5 - 15  CBG monitoring, ED     Status: Abnormal   Collection Time: 02/23/15  8:43 PM  Result Value Ref Range   Glucose-Capillary 43 (LL) 70 - 99 mg/dL    Comment: REPEATED TO VERIFY, CHARGE CREDITED Performed at [place] [facility] Document in Chart    Comment 2 Repeat Test   CBG monitoring, ED     Status: Abnormal   Collection Time: 02/23/15  8:45 PM  Result Value Ref Range   Glucose-Capillary 52 (L) 70 - 99 mg/dL  CBG monitoring, ED     Status: Abnormal   Collection Time: 02/23/15  9:20 PM  Result Value Ref Range   Glucose-Capillary 210 (H) 70 - 99 mg/dL   Comment 1 Document in Chart   Urine Drug Screen     Status: None   Collection Time: 02/23/15  9:40 PM  Result Value Ref Range   Opiates NONE DETECTED NONE DETECTED   Cocaine NONE DETECTED NONE DETECTED   Benzodiazepines NONE DETECTED NONE DETECTED   Amphetamines NONE DETECTED NONE DETECTED   Tetrahydrocannabinol NONE DETECTED NONE DETECTED   Barbiturates NONE DETECTED NONE DETECTED    Comment:        DRUG SCREEN FOR MEDICAL PURPOSES ONLY.  IF CONFIRMATION IS NEEDED FOR ANY PURPOSE, NOTIFY LAB WITHIN 5 DAYS.        LOWEST DETECTABLE LIMITS FOR URINE DRUG SCREEN Drug Class       Cutoff (ng/mL) Amphetamine      1000 Barbiturate      200 Benzodiazepine   638 Tricyclics       756 Opiates          300 Cocaine          300 THC              50   Urinalysis, Routine w reflex microscopic     Status: Abnormal   Collection Time: 02/23/15  9:40 PM  Result Value Ref Range   Color, Urine YELLOW YELLOW   APPearance CLEAR CLEAR   Specific Gravity, Urine 1.012 1.005 - 1.030   pH 7.5 5.0 - 8.0   Glucose, UA 250 (A) NEGATIVE mg/dL   Hgb urine dipstick NEGATIVE NEGATIVE   Bilirubin Urine NEGATIVE NEGATIVE    Ketones, ur NEGATIVE NEGATIVE mg/dL   Protein, ur NEGATIVE NEGATIVE mg/dL   Urobilinogen, UA 1.0 0.0 -  1.0 mg/dL   Nitrite NEGATIVE NEGATIVE   Leukocytes, UA TRACE (A) NEGATIVE  Urine microscopic-add on     Status: None   Collection Time: 02/23/15  9:40 PM  Result Value Ref Range   WBC, UA 3-6 <3 WBC/hpf  CBG monitoring, ED     Status: Abnormal   Collection Time: 02/23/15 11:22 PM  Result Value Ref Range   Glucose-Capillary 153 (H) 70 - 99 mg/dL   Comment 1 Document in Chart   CBG monitoring, ED     Status: Abnormal   Collection Time: 02/24/15  1:14 AM  Result Value Ref Range   Glucose-Capillary 182 (H) 70 - 99 mg/dL   Comment 1 Document in Chart   CBG monitoring, ED     Status: Abnormal   Collection Time: 02/24/15  5:18 AM  Result Value Ref Range   Glucose-Capillary 269 (H) 70 - 99 mg/dL   Comment 1 Notify RN    Comment 2 Document in Chart   CBG monitoring, ED     Status: Abnormal   Collection Time: 02/24/15  6:59 AM  Result Value Ref Range   Glucose-Capillary 165 (H) 70 - 99 mg/dL   Comment 1 Notify RN    Comment 2 Document in Chart   CBG monitoring, ED     Status: Abnormal   Collection Time: 02/24/15  9:36 AM  Result Value Ref Range   Glucose-Capillary 57 (L) 70 - 99 mg/dL  CBG monitoring, ED     Status: Abnormal   Collection Time: 02/24/15 10:55 AM  Result Value Ref Range   Glucose-Capillary 265 (H) 70 - 99 mg/dL  CBC with Differential/Platelet     Status: Abnormal   Collection Time: 02/24/15  1:37 PM  Result Value Ref Range   WBC 11.6 (H) 4.0 - 10.5 K/uL   RBC 4.05 (L) 4.22 - 5.81 MIL/uL   Hemoglobin 12.1 (L) 13.0 - 17.0 g/dL   HCT 36.4 (L) 39.0 - 52.0 %   MCV 89.9 78.0 - 100.0 fL   MCH 29.9 26.0 - 34.0 pg   MCHC 33.2 30.0 - 36.0 g/dL   RDW 12.9 11.5 - 15.5 %   Platelets 199 150 - 400 K/uL   Neutrophils Relative % 79 (H) 43 - 77 %   Neutro Abs 9.2 (H) 1.7 - 7.7 K/uL   Lymphocytes Relative 14 12 - 46 %   Lymphs Abs 1.6 0.7 - 4.0 K/uL   Monocytes Relative  5 3 - 12 %   Monocytes Absolute 0.6 0.1 - 1.0 K/uL   Eosinophils Relative 2 0 - 5 %   Eosinophils Absolute 0.3 0.0 - 0.7 K/uL   Basophils Relative 0 0 - 1 %   Basophils Absolute 0.0 0.0 - 0.1 K/uL  Basic metabolic panel     Status: Abnormal   Collection Time: 02/24/15  1:37 PM  Result Value Ref Range   Sodium 142 135 - 145 mmol/L   Potassium 4.3 3.5 - 5.1 mmol/L   Chloride 104 101 - 111 mmol/L   CO2 29 22 - 32 mmol/L   Glucose, Bld 281 (H) 70 - 99 mg/dL   BUN 26 (H) 6 - 20 mg/dL   Creatinine, Ser 1.05 0.61 - 1.24 mg/dL   Calcium 9.0 8.9 - 10.3 mg/dL   GFR calc non Af Amer >60 >60 mL/min   GFR calc Af Amer >60 >60 mL/min    Comment: (NOTE) The eGFR has been calculated using the CKD EPI equation. This calculation  has not been validated in all clinical situations. eGFR's persistently <90 mL/min signify possible Chronic Kidney Disease.    Anion gap 9 5 - 15  Vitamin B1     Status: None   Collection Time: 02/24/15  1:37 PM  Result Value Ref Range   Vitamin B1 (Thiamine) 118.7 66.5 - 200.0 nmol/L    Comment: (NOTE) Performed At: [facility] [place], [place] [id number] Lindon Romp MD ER:[id number]   HIV antibody     Status: None   Collection Time: 02/24/15  1:37 PM  Result Value Ref Range   HIV Screen 4th Generation wRfx Non Reactive Non Reactive    Comment: (NOTE) Performed At: [facility] [place], [place] [id number] Lindon Romp MD IW:[id number]   Vitamin B12     Status: Abnormal   Collection Time: 02/24/15  2:18 PM  Result Value Ref Range   Vitamin B-12 1581 (H) 180 - 914 pg/mL    Comment: (NOTE) This assay is not validated for testing neonatal or myeloproliferative syndrome specimens for Vitamin B12 levels. Performed at [facility]   Folate     Status: None   Collection Time: 02/24/15  2:18 PM  Result Value Ref Range   Folate 10.5 >5.9 ng/mL    Comment: Performed at [facility]  TSH      Status: None   Collection Time: 02/24/15  2:19 PM  Result Value Ref Range   TSH 0.533 0.350 - 4.500 uIU/mL  CBG monitoring, ED     Status: Abnormal   Collection Time: 02/24/15  6:42 PM  Result Value Ref Range   Glucose-Capillary 436 (H) 70 - 99 mg/dL   Comment 1 Notify RN   CBG monitoring, ED     Status: Abnormal   Collection Time: 02/24/15 11:34 PM  Result Value Ref Range   Glucose-Capillary 168 (H) 70 - 99 mg/dL   Comment 1 Notify RN    Comment 2 Document in Chart   CBG monitoring, ED     Status: Abnormal   Collection Time: 02/25/15  7:25 AM  Result Value Ref Range   Glucose-Capillary 250 (H) 70 - 99 mg/dL   Comment 1 Document in Chart   CBG monitoring, ED     Status: Abnormal   Collection Time: 02/25/15  8:46 AM  Result Value Ref Range   Glucose-Capillary 239 (H) 70 - 99 mg/dL    Vitals: Blood pressure 124/58, pulse 63, temperature 98.2 F (36.8 C), temperature source Oral, resp. rate 18, weight 48.49 kg (106 lb 14.4 oz), SpO2 95 %.  Risk to Self: Suicidal Ideation:  (UTA) Suicidal Intent:  (UTA) Is patient at risk for suicide?:  (UTA) Suicidal Plan?:  (UTA) Access to Means:  (UTA) What has been your use of drugs/alcohol within the last 12 months?:  (UTA) How many times?:  (UTA) Other Self Harm Risks:  (UTA) Triggers for Past Attempts:  (UTA) Intentional Self Injurious Behavior:  (UTA) Risk to Others: Homicidal Ideation:  (UTA) Thoughts of Harm to Others:  (UTA) Current Homicidal Intent:  (UTA) Current Homicidal Plan:  (UTA) Access to Homicidal Means:  (UTA) Identified Victim:  (UTA) History of harm to others?:  (UTA) Assessment of Violence:  (UTA) Violent Behavior Description:  (UTA) Does patient have access to weapons?:  (UTA) Criminal Charges Pending?:  (UTA) Does patient have a court date:  (UTA) Prior Inpatient Therapy: Prior Inpatient Therapy:  (UTA) Prior Outpatient Therapy: Prior Outpatient Therapy:  (  UTA) Does patient have an ACCT team?: Unknown Does  patient have Intensive In-House Services?  : Unknown Does patient have [facility] services? : Unknown Does patient have P4CC services?: Unknown  Current Facility-Administered Medications  Medication Dose Route Frequency Provider Last Rate Last Dose  . albuterol (PROVENTIL HFA;VENTOLIN HFA) 108 (90 BASE) MCG/ACT inhaler 2 puff  2 puff Inhalation QID Orpah Greek, MD   2 puff at 02/24/15 1847  . amLODipine (NORVASC) tablet 5 mg  5 mg Oral Daily Jani Gravel, MD   5 mg at 02/25/15 0959  . famotidine (PEPCID) tablet 20 mg  20 mg Oral BID Orpah Greek, MD   20 mg at 02/25/15 0958  . insulin aspart (novoLOG) injection 0-9 Units  0-9 Units Subcutaneous TID WC Fredia Sorrow, MD   5 Units at 02/25/15 1446  . metFORMIN (GLUCOPHAGE) tablet 500 mg  500 mg Oral BID WC Caren Griffins, MD   500 mg at 02/25/15 0957  . sertraline (ZOLOFT) tablet 25 mg  25 mg Oral Daily Orpah Greek, MD   25 mg at 02/25/15 0958  . simvastatin (ZOCOR) tablet 20 mg  20 mg Oral QPM Orpah Greek, MD   20 mg at 02/24/15 1847  . sodium chloride 0.9 % 1,000 mL with thiamine 865 mg, folic acid 1 mg, multivitamins adult 10 mL infusion   Intravenous Q24H Drema Dallas, DO   Stopped at 02/25/15 0121  . thiamine (B-1) injection 100 mg  100 mg Intravenous Q24H Drema Dallas, DO   100 mg at 02/24/15 1434  . traZODone (DESYREL) tablet 50 mg  50 mg Oral QHS Orpah Greek, MD   50 mg at 02/25/15 0005  . triamterene-hydrochlorothiazide (MAXZIDE-25) 37.5-25 MG per tablet 0.5 tablet  0.5 tablet Oral Daily Orpah Greek, MD   0.5 tablet at 02/25/15 7846   Current Outpatient Prescriptions  Medication Sig Dispense Refill  . predniSONE (DELTASONE) 20 MG tablet 3 tabs po day one, then 2 tabs daily x 4 days (Patient not taking: Reported on 02/23/2015) 11 tablet 0  . predniSONE (STERAPRED UNI-PAK) 10 MG tablet Take 1 tablet (10 mg total) by mouth daily. Day 1: take 6 tabs.  Day 2: 5 tabs  Day 3: 4 tabs  Day  4: 3 tabs  Day 5: 2 tabs  Day 6: 1 tab (Patient not taking: Reported on 02/23/2015) 21 tablet 0    Unable to perform review of system due to patient refusal to participate in assessment, non verbal.  Musculoskeletal: Strength & Muscle Tone: Was lying down in bed Gait & Station: lying down in bed Patient leans: seen in bed  Psychiatric Specialty Exam:     Blood pressure 124/58, pulse 63, temperature 98.2 F (36.8 C), temperature source Oral, resp. rate 18, weight 48.49 kg (106 lb 14.4 oz), SpO2 95 %.There is no height on file to calculate BMI.  General Appearance: Casual and Disheveled  Eye Contact::  None  Speech:  Slow and "Used I don't know"  Volume:  Decreased  Mood:  Angry, Depressed and Dysphoric  Affect:  Congruent  Thought Process:  uta  Orientation:  Other:  uta  Thought Content:  uta  Suicidal Thoughts:  uta  Homicidal Thoughts:  uta  Memory:  uta  Judgement:  Other:  uta  Insight:  uta  Psychomotor Activity:  uta  Concentration:  uta  Recall:  Daphene Jaeger of Knowledge:uta  Language: uta  Akathisia:  NA  Handed:  Right  AIMS (if indicated):     Assets:  Others:  uta  ADL's:  Impaired  Cognition: Impaired,  Moderate  Sleep:      Medical Decision Making: Review of Psycho-Social Stressors (1), Established Problem, Worsening (2), Review of Medication Regimen & Side Effects (2) and Review of New Medication or Change in Dosage (2)  Treatment Plan Summary: Daily contact with patient to assess and evaluate symptoms and progress in treatment, Medication management and Plan Disposition to be determined later since SW is involved.  We have started Trazodone 50 mg qhs for sleep  and Sertraline 25 mg bo daily for depression.  Plan:  Based on information gathered by SW we are now looking at Assisted living facility or Nursing home for patient.  Patient is having difficulty managing his ADLS, he  He is not able to feed himself his full meal.  Psychiatry will continue to round on  patient and make changes to his medication as needed.  Meanwhile we will continue  Offering  the above medications. Disposition: see above  Delfin Gant   PMHNP-BC 02/25/2015 3:30 PM Patient seen face-to-face for psychiatric evaluation, chart reviewed and case discussed with the physician extender and developed treatment plan. Reviewed the information documented and agree with the treatment plan. Corena Pilgrim, MD

## 2015-02-25 NOTE — ED Notes (Signed)
Attempted to sit to edge of bed which patient was unable to do.  Returned to [facility] upright and tray table over. Patient feeding independantly.  SL d/c'd. Site WNL.

## 2015-02-25 NOTE — ED Notes (Signed)
Patient is resting comfortably. Breathing WNL. Stretcher locked and in lowest position.

## 2015-02-25 NOTE — ED Notes (Signed)
Psychiatrist at bedside

## 2015-02-26 DIAGNOSIS — F03918 Unspecified dementia, unspecified severity, with other behavioral disturbance: Secondary | ICD-10-CM | POA: Diagnosis present

## 2015-02-26 DIAGNOSIS — F0391 Unspecified dementia with behavioral disturbance: Secondary | ICD-10-CM | POA: Diagnosis not present

## 2015-02-26 DIAGNOSIS — R627 Adult failure to thrive: Secondary | ICD-10-CM | POA: Diagnosis not present

## 2015-02-26 LAB — CBG MONITORING, ED
GLUCOSE-CAPILLARY: 219 mg/dL — AB (ref 70–99)
Glucose-Capillary: 276 mg/dL — ABNORMAL HIGH (ref 70–99)
Glucose-Capillary: 292 mg/dL — ABNORMAL HIGH (ref 70–99)
Glucose-Capillary: 228 mg/dL — ABNORMAL HIGH (ref 70–99)

## 2015-02-26 MED ORDER — VITAMIN B-1 100 MG PO TABS
100.0000 mg | ORAL_TABLET | Freq: Every day | ORAL | Status: AC
  Administered 2015-02-26: 100 mg via ORAL
  Filled 2015-02-26: qty 1

## 2015-02-26 NOTE — Progress Notes (Signed)
   02/26/15 1500  PT Visit Information  Pt tolerated session well today. Did not agree to a lot, but start with ambulation. Nurse stated pt did ambulate earlier from the  shower. Pt is weak and would benefit from continued walking from staff and mobility.   Last PT Received On 02/26/15  Assistance Needed +1  History of Present Illness "69 y.o. male hx of DM, COPD, anxiety and question of dementia presenting as IVC due to inability to take care of himself. Per chart review, he was recently released from jail and was brought to [facility] ED after refusing to leave the jail. Police attempted to place patient in multiple shelters but he was refused due to prior admission and discharge from those facilities." -per neurology note  PT Time Calculation  PT Start Time (ACUTE ONLY) 1530  PT Stop Time (ACUTE ONLY) 1542  PT Time Calculation (min) (ACUTE ONLY) 12 min  Subjective Data  Subjective I am weak, I do't want to fall"  Precautions  Precautions Fall  Pain Assessment  Pain Assessment (pt stated R right hip hurts some from a fall last week)  Cognition  Arousal/Alertness Awake/alert  Behavior During Therapy Flat affect  Overall Cognitive Status Difficult to assess  Difficult to assess due to (pt remains guarded, mostly nonverbal)  Bed Mobility  Overal bed mobility Needs Assistance  Bed Mobility Supine to Sit;Sit to Supine  Supine to sit Min guard  Sit to supine Min guard  General bed mobility comments pt more alert todaya dn cooperative, just weak during mobility  Transfers  Overall transfer level Needs assistance  Equipment used 1 person hand held assist  Transfers Sit to/from Stand  Sit to Stand Min guard  General transfer comment cues for safety and guidance for movment that we were trying to achieve  Ambulation/Gait  Ambulation/Gait assistance Min guard  Ambulation Distance (Feet) 80 Feet  Assistive device 1 person hand held assist (and at end of walk , no person assit )  General Gait  Details Just a little unsteady, wide BOS due to generalized weakness as repeorted by pt as well  Gait Pattern/deviations Staggering left;Staggering right  PT - End of Session  Activity Tolerance Patient tolerated treatment well  Patient left in bed;with call bell/phone within reach  Nurse Communication Mobility status  PT - Assessment/Plan  PT Plan Current plan remains appropriate  PT Frequency (ACUTE ONLY) Min 2X/week  Follow Up Recommendations SNF;Supervision/Assistance - 24 hour (if PT available where pt goes, pt would benefit)  PT equipment Other (comment) (unknown at this time)  PT Goal Progression  Progress towards PT goals Progressing toward goals  Acute Rehab PT Goals  PT Goal Formulation With patient  Time For Goal Achievement 03/10/15  Potential to Achieve Goals Fair  PT G-Codes **NOT FOR INPATIENT CLASS**  Functional Assessment Tool Used clinical judgement   Functional Limitation Mobility: Walking and moving around  Mobility: Walking and Moving Around Current Status (Q7622) CI  Mobility: Walking and Moving Around Goal Status (Q3335) [facility]  PT General Charges  $$ ACUTE PT VISIT 1 Procedure  PT Treatments  $Gait Training 8-22 mins  Clide Dales, [place] Pager: [phone] 02/26/2015

## 2015-02-26 NOTE — Progress Notes (Signed)
Pt has been accepted tentatively to [facility] on Monday. Patient is being followed by APS. CSW calling APS worker Asencion Partridge to assist with medicaid special assistance medicaid. FL2 to be updated with multi infart diagnosis from neuro consult which will qualify patient for special assistance.    Edwin [name], [place] Work  [employer] [phone]

## 2015-02-26 NOTE — Consult Note (Signed)
[facility] Consult   Reason for Consult:  UNSPECIFIED MOOD DISORDER, Dementia with aggressive behaviors Referring Physician:  EDP Patient Identification: Edwin [name] MRN:  [id number] Principal Diagnosis: Dementia with aggressive behavior Diagnosis:   Patient Active Problem List   Diagnosis Date Noted  . 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 Hyperglycemia [R73.9] 02/23/2015    Priority: High  . Renal insufficiency [N28.9] 02/23/2015    Priority: High  . Encounter for preadmission testing [Z01.818]   . DM [E11.9] 10/02/2007  . HYPERTENSION [I10] 10/02/2007  . 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 ALLERGIC RHINITIS [J30.9] 10/02/2007  . EMPHYSEMA [J43.8] 10/02/2007  . COPD [J44.9] 10/02/2007  . 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 HEADACHE, CHRONIC [R51] 10/02/2007  . HYPERGLYCEMIA [R73.09] 10/02/2007    Total Time spent with patient: 30 minutes   Subjective:   Edwin [name] is a 69 y.o. male patient admitted with dementia, aggressive behaviors.  HPI:  Patient continues to be belligerent in the morning with cursing and increase in agitation.  Irritable on exam, unstable mood state and inability to care for his chronic medical disorders, unsafe.  HPI Elements:   Location:  Unspecified mood disorder. Quality:  unknown. Severity:  uta. Timing:  Acute. Duration:  UTA. Context:  UTA.  Past Medical History:  Past Medical History  Diagnosis Date  . COPD (chronic obstructive pulmonary disease)   . Diabetes mellitus without complication   . Chest pain   . Anxiety    History reviewed. No pertinent past surgical history. Family History:  Family History  Problem Relation Age of Onset  . Family history unknown: Yes   Social History:  History  Alcohol Use No     History  Drug Use No    History   Social History  . Marital Status: Single    Spouse Name: N/A  . Number of Children: N/A  . Years of  Education: N/A   Social History Main Topics  . Smoking status: Current Every Day Smoker    Types: Cigarettes  . Smokeless tobacco: Not on file  . Alcohol Use: No  . Drug Use: No  . Sexual Activity: Not on file   Other Topics Concern  . None   Social History Narrative   Additional Social History:    Pain Medications: UTA Prescriptions: UTA Over the Counter: UTA History of alcohol / drug use?: No history of alcohol / drug abuse Longest period of sobriety (when/how long): UTA                     Allergies:  No Known Allergies  Labs:  Results for orders placed or performed during the hospital encounter of 02/23/15 (from the past 48 hour(s))  CBG monitoring, ED     Status: Abnormal   Collection Time: 02/24/15  6:42 PM  Result Value Ref Range   Glucose-Capillary 436 (H) 70 - 99 mg/dL   Comment 1 Notify RN   CBG monitoring, ED     Status: Abnormal   Collection Time: 02/24/15 11:34 PM  Result Value Ref Range   Glucose-Capillary 168 (H) 70 - 99 mg/dL   Comment 1 Notify RN    Comment 2 Document in Chart   CBG monitoring, ED     Status: Abnormal   Collection Time: 02/25/15  7:25 AM  Result Value Ref Range   Glucose-Capillary 250 (H) 70 - 99 mg/dL   Comment 1 Document in Chart  CBG monitoring, ED     Status: Abnormal   Collection Time: 02/25/15  8:46 AM  Result Value Ref Range   Glucose-Capillary 239 (H) 70 - 99 mg/dL  CBG monitoring, ED     Status: Abnormal   Collection Time: 02/25/15  2:39 PM  Result Value Ref Range   Glucose-Capillary 256 (H) 70 - 99 mg/dL  CBG monitoring, ED     Status: Abnormal   Collection Time: 02/25/15  6:15 PM  Result Value Ref Range   Glucose-Capillary 203 (H) 70 - 99 mg/dL  CBG monitoring, ED     Status: Abnormal   Collection Time: 02/26/15  7:52 AM  Result Value Ref Range   Glucose-Capillary 276 (H) 70 - 99 mg/dL  CBG monitoring, ED     Status: Abnormal   Collection Time: 02/26/15 12:13 PM  Result Value Ref Range    Glucose-Capillary 292 (H) 70 - 99 mg/dL    Vitals: Blood pressure 118/80, pulse 66, temperature 97.5 F (36.4 C), temperature source Oral, resp. rate 22, weight 106 lb 14.4 oz (48.49 kg), SpO2 99 %.  Risk to Self: Suicidal Ideation:  (UTA) Suicidal Intent:  (UTA) Is patient at risk for suicide?:  (UTA) Suicidal Plan?:  (UTA) Access to Means:  (UTA) What has been your use of drugs/alcohol within the last 12 months?:  (UTA) How many times?:  (UTA) Other Self Harm Risks:  (UTA) Triggers for Past Attempts:  (UTA) Intentional Self Injurious Behavior:  (UTA) Risk to Others: Homicidal Ideation:  (UTA) Thoughts of Harm to Others:  (UTA) Current Homicidal Intent:  (UTA) Current Homicidal Plan:  (UTA) Access to Homicidal Means:  (UTA) Identified Victim:  (UTA) History of harm to others?:  (UTA) Assessment of Violence:  (UTA) Violent Behavior Description:  (UTA) Does patient have access to weapons?:  (UTA) Criminal Charges Pending?:  (UTA) Does patient have a court date:  Special educational needs teacher) Prior Inpatient Therapy: Prior Inpatient Therapy:  (UTA) Prior Outpatient Therapy: Prior Outpatient Therapy:  (UTA) Does patient have an ACCT team?: Unknown Does patient have Intensive In-House Services?  : Unknown Does patient have [facility] services? : Unknown Does patient have P4CC services?: Unknown  Current Facility-Administered Medications  Medication Dose Route Frequency Provider Last Rate Last Dose  . amLODipine (NORVASC) tablet 5 mg  5 mg Oral Daily Jani Gravel, MD   5 mg at 02/26/15 0915  . famotidine (PEPCID) tablet 20 mg  20 mg Oral BID Orpah Greek, MD   20 mg at 02/26/15 0915  . insulin aspart (novoLOG) injection 0-9 Units  0-9 Units Subcutaneous TID WC Fredia Sorrow, MD   5 Units at 02/26/15 1300  . metFORMIN (GLUCOPHAGE) tablet 500 mg  500 mg Oral BID WC Caren Griffins, MD   500 mg at 02/26/15 0915  . sertraline (ZOLOFT) tablet 25 mg  25 mg Oral Daily Orpah Greek, MD   25 mg at  02/26/15 0915  . simvastatin (ZOCOR) tablet 20 mg  20 mg Oral QPM Orpah Greek, MD   20 mg at 02/25/15 1818  . traZODone (DESYREL) tablet 50 mg  50 mg Oral QHS Orpah Greek, MD   50 mg at 02/25/15 2148  . triamterene-hydrochlorothiazide (MAXZIDE-25) 37.5-25 MG per tablet 0.5 tablet  0.5 tablet Oral Daily Orpah Greek, MD   0.5 tablet at 02/26/15 9798   Current Outpatient Prescriptions  Medication Sig Dispense Refill  . predniSONE (DELTASONE) 20 MG tablet 3 tabs po day one, then 2 tabs  daily x 4 days (Patient not taking: Reported on 02/23/2015) 11 tablet 0  . predniSONE (STERAPRED UNI-PAK) 10 MG tablet Take 1 tablet (10 mg total) by mouth daily. Day 1: take 6 tabs.  Day 2: 5 tabs  Day 3: 4 tabs  Day 4: 3 tabs  Day 5: 2 tabs  Day 6: 1 tab (Patient not taking: Reported on 02/23/2015) 21 tablet 0    Unable to perform review of system due to patient refusal to participate in assessment, non verbal.  Musculoskeletal: Strength & Muscle Tone: WDL Gait & Station: WDL Patient leans: NA  Psychiatric Specialty Exam:     Blood pressure 118/80, pulse 66, temperature 97.5 F (36.4 C), temperature source Oral, resp. rate 22, weight 106 lb 14.4 oz (48.49 kg), SpO2 99 %.There is no height on file to calculate BMI.  General Appearance: Casual and Disheveled  Eye Contact::  None  Speech:  Slow and "Used I don't know"  Volume:  Decreased  Mood:  Irritable  Affect:  Congruent  Thought Process:  uta  Orientation:  Other:  uta  Thought Content:  uta  Suicidal Thoughts:  uta  Homicidal Thoughts:  uta  Memory:  uta  Judgement:  Other:  uta  Insight:  uta  Psychomotor Activity:  uta  Concentration:  uta  Recall:  uta  Fund of Knowledge:uta  Language: uta  Akathisia:  NA  Handed:  Right  AIMS (if indicated):     Assets:  Others:  uta  ADL's:  Impaired  Cognition: Impaired,  Moderate  Sleep:      Medical Decision Making: Review of Psycho-Social Stressors (1), Established  Problem, Worsening (2), Review of Medication Regimen & Side Effects (2) and Review of New Medication or Change in Dosage (2)  Treatment Plan Summary: Daily contact with patient to assess and evaluate symptoms and progress in treatment, Medication management and Plan Disposition to be determined later since SW is involved.  We have started Trazodone 50 mg qhs for sleep  and Sertraline 25 mg bo daily for depression.  Plan:  Based on information gathered by SW we are now looking at Assisted living facility or Nursing home for patient.  Patient is having difficulty managing his ADLS, he  He is not able to feed himself his full meal.  Psychiatry will continue to round on patient and make changes to his medication as needed.  Meanwhile we will continue  Offering  the above medications. Disposition: see above  Waylan Boga   PMHNP-BC 02/26/2015 2:53 PM Patient seen face-to-face for psychiatric evaluation, chart reviewed and case discussed with the physician extender and developed treatment plan. Reviewed the information documented and agree with the treatment plan. Corena Pilgrim, MD

## 2015-02-26 NOTE — Progress Notes (Signed)
CSW left message for [facility] to follow up on referral.   Belia Heman, [place] Work  [employer] [phone]

## 2015-02-27 DIAGNOSIS — F0391 Unspecified dementia with behavioral disturbance: Secondary | ICD-10-CM | POA: Diagnosis not present

## 2015-02-27 DIAGNOSIS — R627 Adult failure to thrive: Secondary | ICD-10-CM | POA: Diagnosis not present

## 2015-02-27 LAB — CBG MONITORING, ED
GLUCOSE-CAPILLARY: 267 mg/dL — AB (ref 70–99)
GLUCOSE-CAPILLARY: 272 mg/dL — AB (ref 70–99)
Glucose-Capillary: 172 mg/dL — ABNORMAL HIGH (ref 70–99)

## 2015-02-27 NOTE — ED Notes (Signed)
Pt ate about 75% of breakfast.  Did not drink the shake due to the fact that she only likes chocolate.

## 2015-02-27 NOTE — ED Notes (Signed)
Patient alert, sitting on side of bed, respirations even and unlabored, skin warm and dry, in NAD, denies further needs, sitter at bedside. Will continue to monitor.

## 2015-02-27 NOTE — ED Notes (Signed)
Awaiting meal tray to give metformin and novolog.

## 2015-02-27 NOTE — Consult Note (Signed)
[facility] Consult   Reason for Consult:  UNSPECIFIED MOOD DISORDER, Dementia with aggressive behaviors Referring Physician:  EDP Patient Identification: [name] MRN:  [id number] Principal Diagnosis: Dementia with aggressive behavior Diagnosis:   Patient Active Problem List   Diagnosis Date Noted  . 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 Hyperglycemia [R73.9] 02/23/2015    Priority: High  . Renal insufficiency [N28.9] 02/23/2015    Priority: High  . Encounter for preadmission testing [Z01.818]   . DM [E11.9] 10/02/2007  . HYPERTENSION [I10] 10/02/2007  . 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 ALLERGIC RHINITIS [J30.9] 10/02/2007  . EMPHYSEMA [J43.8] 10/02/2007  . COPD [J44.9] 10/02/2007  . 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 HEADACHE, CHRONIC [R51] 10/02/2007  . HYPERGLYCEMIA [R73.09] 10/02/2007    Total Time spent with patient: 30 minutes   Subjective:   [name] is a 69 y.o. male patient admitted with dementia, aggressive behaviors.  HPI:  Patient was calm and cooperative this morning.  His ADL time was changed to the afternoon when he is more calm.  He appears to be more agitated in the am.  Placement continues to be sought.  HPI Elements:   Location:  Unspecified mood disorder. Quality:  unknown. Severity:  uta. Timing:  Acute. Duration:  UTA. Context:  UTA.  Past Medical History:  Past Medical History  Diagnosis Date  . COPD (chronic obstructive pulmonary disease)   . Diabetes mellitus without complication   . Chest pain   . Anxiety    History reviewed. No pertinent past surgical history. Family History:  Family History  Problem Relation Age of Onset  . Family history unknown: Yes   Social History:  History  Alcohol Use No     History  Drug Use No    History   Social History  . Marital Status: Single    Spouse Name: N/A  . Number of Children: N/A  . Years of  Education: N/A   Social History Main Topics  . Smoking status: Current Every Day Smoker    Types: Cigarettes  . Smokeless tobacco: Not on file  . Alcohol Use: No  . Drug Use: No  . Sexual Activity: Not on file   Other Topics Concern  . None   Social History Narrative   Additional Social History:    Pain Medications: UTA Prescriptions: UTA Over the Counter: UTA History of alcohol / drug use?: No history of alcohol / drug abuse Longest period of sobriety (when/how long): UTA                     Allergies:  No Known Allergies  Labs:  Results for orders placed or performed during the hospital encounter of 02/23/15 (from the past 48 hour(s))  CBG monitoring, ED     Status: Abnormal   Collection Time: 02/25/15  2:39 PM  Result Value Ref Range   Glucose-Capillary 256 (H) 70 - 99 mg/dL  CBG monitoring, ED     Status: Abnormal   Collection Time: 02/25/15  6:15 PM  Result Value Ref Range   Glucose-Capillary 203 (H) 70 - 99 mg/dL  CBG monitoring, ED     Status: Abnormal   Collection Time: 02/26/15  7:52 AM  Result Value Ref Range   Glucose-Capillary 276 (H) 70 - 99 mg/dL  CBG monitoring, ED     Status: Abnormal   Collection Time: 02/26/15 12:13 PM  Result Value Ref  Range   Glucose-Capillary 292 (H) 70 - 99 mg/dL  CBG monitoring, ED     Status: Abnormal   Collection Time: 02/26/15  4:56 PM  Result Value Ref Range   Glucose-Capillary 228 (H) 70 - 99 mg/dL  CBG monitoring, ED     Status: Abnormal   Collection Time: 02/26/15  9:31 PM  Result Value Ref Range   Glucose-Capillary 219 (H) 70 - 99 mg/dL   Comment 1 Notify RN   CBG monitoring, ED     Status: Abnormal   Collection Time: 02/27/15  8:02 AM  Result Value Ref Range   Glucose-Capillary 267 (H) 70 - 99 mg/dL    Vitals: Blood pressure 144/82, pulse 110, temperature 97.8 F (36.6 C), temperature source Oral, resp. rate 18, weight 106 lb 14.4 oz (48.49 kg), SpO2 90 %.  Risk to Self: Suicidal Ideation:   (UTA) Suicidal Intent:  (UTA) Is patient at risk for suicide?:  (UTA) Suicidal Plan?:  (UTA) Access to Means:  (UTA) What has been your use of drugs/alcohol within the last 12 months?:  (UTA) How many times?:  (UTA) Other Self Harm Risks:  (UTA) Triggers for Past Attempts:  (UTA) Intentional Self Injurious Behavior:  (UTA) Risk to Others: Homicidal Ideation:  (UTA) Thoughts of Harm to Others:  (UTA) Current Homicidal Intent:  (UTA) Current Homicidal Plan:  (UTA) Access to Homicidal Means:  (UTA) Identified Victim:  (UTA) History of harm to others?:  (UTA) Assessment of Violence:  (UTA) Violent Behavior Description:  (UTA) Does patient have access to weapons?:  (UTA) Criminal Charges Pending?:  (UTA) Does patient have a court date:  Special educational needs teacher) Prior Inpatient Therapy: Prior Inpatient Therapy:  (UTA) Prior Outpatient Therapy: Prior Outpatient Therapy:  (UTA) Does patient have an ACCT team?: Unknown Does patient have Intensive In-House Services?  : Unknown Does patient have [facility] services? : Unknown Does patient have [facility] services?: Unknown  Current Facility-Administered Medications  Medication Dose Route Frequency Provider Last Rate Last Dose  . amLODipine (NORVASC) tablet 5 mg  5 mg Oral Daily Jani Gravel, MD   5 mg at 02/27/15 0917  . famotidine (PEPCID) tablet 20 mg  20 mg Oral BID Orpah Greek, MD   20 mg at 02/27/15 0847  . insulin aspart (novoLOG) injection 0-9 Units  0-9 Units Subcutaneous TID WC Fredia Sorrow, MD   5 Units at 02/27/15 0849  . metFORMIN (GLUCOPHAGE) tablet 500 mg  500 mg Oral BID WC Caren Griffins, MD   500 mg at 02/27/15 0916  . sertraline (ZOLOFT) tablet 25 mg  25 mg Oral Daily Orpah Greek, MD   25 mg at 02/27/15 0916  . simvastatin (ZOCOR) tablet 20 mg  20 mg Oral QPM Orpah Greek, MD   20 mg at 02/26/15 1808  . traZODone (DESYREL) tablet 50 mg  50 mg Oral QHS Orpah Greek, MD   50 mg at 02/26/15 2145  .  triamterene-hydrochlorothiazide (MAXZIDE-25) 37.5-25 MG per tablet 0.5 tablet  0.5 tablet Oral Daily Orpah Greek, MD   0.5 tablet at 02/27/15 7412   Current Outpatient Prescriptions  Medication Sig Dispense Refill  . predniSONE (DELTASONE) 20 MG tablet 3 tabs po day one, then 2 tabs daily x 4 days (Patient not taking: Reported on 02/23/2015) 11 tablet 0  . predniSONE (STERAPRED UNI-PAK) 10 MG tablet Take 1 tablet (10 mg total) by mouth daily. Day 1: take 6 tabs.  Day 2: 5 tabs  Day 3: 4 tabs  Day 4: 3 tabs  Day 5: 2 tabs  Day 6: 1 tab (Patient not taking: Reported on 02/23/2015) 21 tablet 0    Unable to perform review of system due to patient refusal to participate in assessment, non verbal.  Musculoskeletal: Strength & Muscle Tone: WDL Gait & Station: WDL Patient leans: NA  Psychiatric Specialty Exam:     Blood pressure 144/82, pulse 110, temperature 97.8 F (36.6 C), temperature source Oral, resp. rate 18, weight 106 lb 14.4 oz (48.49 kg), SpO2 90 %.There is no height on file to calculate BMI.  General Appearance: Casual and Disheveled  Eye Contact::  None  Speech:  Slow and "Used I don't know"  Volume:  Decreased  Mood:  Irritable  Affect:  Congruent  Thought Process:  uta  Orientation:  Other:  uta  Thought Content:  uta  Suicidal Thoughts:  uta  Homicidal Thoughts:  uta  Memory:  uta  Judgement:  Other:  uta  Insight:  uta  Psychomotor Activity:  uta  Concentration:  uta  Recall:  uta  Fund of Knowledge:uta  Language: uta  Akathisia:  NA  Handed:  Right  AIMS (if indicated):     Assets:  Others:  uta  ADL's:  Impaired  Cognition: Impaired,  Moderate  Sleep:      Medical Decision Making: Review of Psycho-Social Stressors (1), Established Problem, Worsening (2), Review of Medication Regimen & Side Effects (2) and Review of New Medication or Change in Dosage (2)  Treatment Plan Summary: Daily contact with patient to assess and evaluate symptoms and progress  in treatment, Medication management and Plan Disposition to be determined later since SW is involved.  We have started Trazodone 50 mg qhs for sleep  and Sertraline 25 mg bo daily for depression.  Plan:  Based on information gathered by SW we are now looking at Assisted living facility or Nursing home for patient.  Patient is having difficulty managing his ADLS, he  He is not able to feed himself his full meal.  Psychiatry will continue to round on patient and make changes to his medication as needed.  Meanwhile we will continue  Offering  the above medications. Disposition: see above  Waylan Boga   PMHNP-BC 02/27/2015 12:17 PM Patient seen face-to-face for psychiatric evaluation, chart reviewed and case discussed with the physician extender and developed treatment plan. Reviewed the information documented and agree with the treatment plan. Corena Pilgrim, MD

## 2015-02-28 DIAGNOSIS — R627 Adult failure to thrive: Secondary | ICD-10-CM | POA: Diagnosis not present

## 2015-02-28 DIAGNOSIS — F0391 Unspecified dementia with behavioral disturbance: Secondary | ICD-10-CM | POA: Diagnosis not present

## 2015-02-28 LAB — CBG MONITORING, ED
GLUCOSE-CAPILLARY: 131 mg/dL — AB (ref 70–99)
Glucose-Capillary: 249 mg/dL — ABNORMAL HIGH (ref 70–99)
Glucose-Capillary: 344 mg/dL — ABNORMAL HIGH (ref 70–99)
Glucose-Capillary: 304 mg/dL — ABNORMAL HIGH (ref 70–99)

## 2015-02-28 NOTE — Consult Note (Signed)
[facility] Psychiatry Consult   Reason for Consult:  UNSPECIFIED MOOD DISORDER, Dementia with aggressive behaviors Referring Physician:  EDP Patient Identification: Edwin [name] MRN:  [id number] Principal Diagnosis: Dementia with aggressive behavior Diagnosis:   Patient Active Problem List   Diagnosis Date Noted  . 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 Hyperglycemia [R73.9] 02/23/2015    Priority: High  . Renal insufficiency [N28.9] 02/23/2015    Priority: High  . Encounter for preadmission testing [Z01.818]   . DM [E11.9] 10/02/2007  . HYPERTENSION [I10] 10/02/2007  . 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 ALLERGIC RHINITIS [J30.9] 10/02/2007  . EMPHYSEMA [J43.8] 10/02/2007  . COPD [J44.9] 10/02/2007  . 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 HEADACHE, CHRONIC [R51] 10/02/2007  . HYPERGLYCEMIA [R73.09] 10/02/2007    Total Time spent with patient: 30 minutes  Subjective:   Edwin [name] is a 69 y.o. male patient admitted with dementia, aggressive behaviors.  HPI:  The patient has been calm and cooperative as long as his schedule is followed; ADLs in the afternoons and relaxation in the am.  Placement continues to be sought.  HPI Elements:   Location:  Unspecified mood disorder. Quality:  unknown. Severity:  uta. Timing:  Acute. Duration:  UTA. Context:  UTA.  Past Medical History:  Past Medical History  Diagnosis Date  . COPD (chronic obstructive pulmonary disease)   . Diabetes mellitus without complication   . Chest pain   . Anxiety    History reviewed. No pertinent past surgical history. Family History:  Family History  Problem Relation Age of Onset  . Family history unknown: Yes   Social History:  History  Alcohol Use No     History  Drug Use No    History   Social History  . Marital Status: Single    Spouse Name: N/A  . Number of Children: N/A  . Years of Education: N/A   Social History  Main Topics  . Smoking status: Current Every Day Smoker    Types: Cigarettes  . Smokeless tobacco: Not on file  . Alcohol Use: No  . Drug Use: No  . Sexual Activity: Not on file   Other Topics Concern  . None   Social History Narrative   Additional Social History:    Pain Medications: UTA Prescriptions: UTA Over the Counter: UTA History of alcohol / drug use?: No history of alcohol / drug abuse Longest period of sobriety (when/how long): UTA                     Allergies:  No Known Allergies  Labs:  Results for orders placed or performed during the hospital encounter of 02/23/15 (from the past 48 hour(s))  CBG monitoring, ED     Status: Abnormal   Collection Time: 02/26/15 12:13 PM  Result Value Ref Range   Glucose-Capillary 292 (H) 70 - 99 mg/dL  CBG monitoring, ED     Status: Abnormal   Collection Time: 02/26/15  4:56 PM  Result Value Ref Range   Glucose-Capillary 228 (H) 70 - 99 mg/dL  CBG monitoring, ED     Status: Abnormal   Collection Time: 02/26/15  9:31 PM  Result Value Ref Range   Glucose-Capillary 219 (H) 70 - 99 mg/dL   Comment 1 Notify RN   CBG monitoring, ED     Status: Abnormal   Collection Time: 02/27/15  8:02 AM  Result Value Ref Range  Glucose-Capillary 267 (H) 70 - 99 mg/dL  CBG monitoring, ED     Status: Abnormal   Collection Time: 02/27/15 12:36 PM  Result Value Ref Range   Glucose-Capillary 272 (H) 70 - 99 mg/dL  CBG monitoring, ED     Status: Abnormal   Collection Time: 02/27/15  6:25 PM  Result Value Ref Range   Glucose-Capillary 172 (H) 70 - 99 mg/dL  CBG monitoring, ED     Status: Abnormal   Collection Time: 02/28/15  6:26 AM  Result Value Ref Range   Glucose-Capillary 249 (H) 70 - 99 mg/dL   Comment 1 Document in Chart    Comment 2 Repeat Test     Vitals: Blood pressure 112/79, pulse 74, temperature 98.9 F (37.2 C), temperature source Oral, resp. rate 18, weight 106 lb 14.4 oz (48.49 kg), SpO2 93 %.  Risk to Self:  Suicidal Ideation:  (UTA) Suicidal Intent:  (UTA) Is patient at risk for suicide?:  (UTA) Suicidal Plan?:  (UTA) Access to Means:  (UTA) What has been your use of drugs/alcohol within the last 12 months?:  (UTA) How many times?:  (UTA) Other Self Harm Risks:  (UTA) Triggers for Past Attempts:  (UTA) Intentional Self Injurious Behavior:  (UTA) Risk to Others: Homicidal Ideation:  (UTA) Thoughts of Harm to Others:  (UTA) Current Homicidal Intent:  (UTA) Current Homicidal Plan:  (UTA) Access to Homicidal Means:  (UTA) Identified Victim:  (UTA) History of harm to others?:  (UTA) Assessment of Violence:  (UTA) Violent Behavior Description:  (UTA) Does patient have access to weapons?:  (UTA) Criminal Charges Pending?:  (UTA) Does patient have a court date:  Special educational needs teacher) Prior Inpatient Therapy: Prior Inpatient Therapy:  (UTA) Prior Outpatient Therapy: Prior Outpatient Therapy:  (UTA) Does patient have an ACCT team?: Unknown Does patient have Intensive In-House Services?  : Unknown Does patient have [facility] services? : Unknown Does patient have P4CC services?: Unknown  Current Facility-Administered Medications  Medication Dose Route Frequency Provider Last Rate Last Dose  . amLODipine (NORVASC) tablet 5 mg  5 mg Oral Daily Jani Gravel, MD   5 mg at 02/27/15 0917  . famotidine (PEPCID) tablet 20 mg  20 mg Oral BID Orpah Greek, MD   20 mg at 02/27/15 2212  . insulin aspart (novoLOG) injection 0-9 Units  0-9 Units Subcutaneous TID WC Fredia Sorrow, MD   2 Units at 02/27/15 1832  . metFORMIN (GLUCOPHAGE) tablet 500 mg  500 mg Oral BID WC Caren Griffins, MD   500 mg at 02/27/15 1846  . sertraline (ZOLOFT) tablet 25 mg  25 mg Oral Daily Orpah Greek, MD   25 mg at 02/27/15 0916  . simvastatin (ZOCOR) tablet 20 mg  20 mg Oral QPM Orpah Greek, MD   20 mg at 02/27/15 1847  . traZODone (DESYREL) tablet 50 mg  50 mg Oral QHS Orpah Greek, MD   50 mg at 02/27/15  2212  . triamterene-hydrochlorothiazide (MAXZIDE-25) 37.5-25 MG per tablet 0.5 tablet  0.5 tablet Oral Daily Orpah Greek, MD   0.5 tablet at 02/27/15 6073   Current Outpatient Prescriptions  Medication Sig Dispense Refill  . predniSONE (DELTASONE) 20 MG tablet 3 tabs po day one, then 2 tabs daily x 4 days (Patient not taking: Reported on 02/23/2015) 11 tablet 0  . predniSONE (STERAPRED UNI-PAK) 10 MG tablet Take 1 tablet (10 mg total) by mouth daily. Day 1: take 6 tabs.  Day 2: 5 tabs  Day 3: 4 tabs  Day 4: 3 tabs  Day 5: 2 tabs  Day 6: 1 tab (Patient not taking: Reported on 02/23/2015) 21 tablet 0    Unable to perform review of system due to patient refusal to participate in assessment, non verbal.  Musculoskeletal: Strength & Muscle Tone: WDL Gait & Station: WDL Patient leans: NA  Psychiatric Specialty Exam:     Blood pressure 112/79, pulse 74, temperature 98.9 F (37.2 C), temperature source Oral, resp. rate 18, weight 106 lb 14.4 oz (48.49 kg), SpO2 93 %.There is no height on file to calculate BMI.  General Appearance: Casual and Disheveled  Eye Contact::  None  Speech:  Slow and "Used I don't know"  Volume:  Decreased  Mood:  Irritable  Affect:  Congruent  Thought Process:  uta  Orientation:  Other:  uta  Thought Content:  uta  Suicidal Thoughts:  uta  Homicidal Thoughts:  uta  Memory:  uta  Judgement:  Other:  uta  Insight:  uta  Psychomotor Activity:  uta  Concentration:  uta  Recall:  uta  Fund of Knowledge:uta  Language: uta  Akathisia:  NA  Handed:  Right  AIMS (if indicated):     Assets:  Others:  uta  ADL's:  Impaired  Cognition: Impaired,  Moderate  Sleep:      Medical Decision Making: Review of Psycho-Social Stressors (1), Established Problem, Worsening (2), Review of Medication Regimen & Side Effects (2) and Review of New Medication or Change in Dosage (2)  Treatment Plan Summary: Daily contact with patient to assess and evaluate symptoms and  progress in treatment, Medication management and Plan Disposition to be determined later since SW is involved.  We have started Trazodone 50 mg qhs for sleep  and Sertraline 25 mg bo daily for depression.  Plan:  Based on information gathered by SW we are now looking at Assisted living facility or Nursing home for patient.  Patient is having difficulty managing his ADLS, he  He is not able to feed himself his full meal.  Psychiatry will continue to round on patient and make changes to his medication as needed.  Meanwhile we will continue  Offering  the above medications. Disposition: see above  Waylan Boga   PMHNP-BC 02/28/2015 9:47 AM Patient seen face-to-face for psychiatric evaluation, chart reviewed and case discussed with the physician extender and developed treatment plan. Reviewed the information documented and agree with the treatment plan. Corena Pilgrim, MD

## 2015-02-28 NOTE — ED Notes (Signed)
Pt up to shower with one assist from sitter.

## 2015-03-01 DIAGNOSIS — R627 Adult failure to thrive: Secondary | ICD-10-CM | POA: Diagnosis not present

## 2015-03-01 DIAGNOSIS — F0391 Unspecified dementia with behavioral disturbance: Secondary | ICD-10-CM

## 2015-03-01 LAB — CBG MONITORING, ED
GLUCOSE-CAPILLARY: 104 mg/dL — AB (ref 70–99)
Glucose-Capillary: 276 mg/dL — ABNORMAL HIGH (ref 70–99)
Glucose-Capillary: 219 mg/dL — ABNORMAL HIGH (ref 70–99)

## 2015-03-01 MED ORDER — TRIAMTERENE-HCTZ 37.5-25 MG PO TABS: 0.5000 | ORAL_TABLET | Freq: Every day | ORAL | Status: AC

## 2015-03-01 MED ORDER — SIMVASTATIN 20 MG PO TABS: 20.0000 mg | ORAL_TABLET | Freq: Every evening | ORAL | Status: AC

## 2015-03-01 MED ORDER — QUETIAPINE FUMARATE 50 MG PO TABS
50.0000 mg | ORAL_TABLET | Freq: Every day | ORAL | Status: DC
  Administered 2015-03-01: 50 mg via ORAL
  Filled 2015-03-01: qty 1

## 2015-03-01 MED ORDER — METFORMIN HCL 500 MG PO TABS: 500.0000 mg | ORAL_TABLET | Freq: Two times a day (BID) | ORAL | Status: DC

## 2015-03-01 MED ORDER — QUETIAPINE FUMARATE 50 MG PO TABS: 50.0000 mg | ORAL_TABLET | Freq: Every day | ORAL | Status: DC

## 2015-03-01 MED ORDER — FAMOTIDINE 20 MG PO TABS: 20.0000 mg | ORAL_TABLET | Freq: Two times a day (BID) | ORAL | Status: AC

## 2015-03-01 MED ORDER — SERTRALINE HCL 25 MG PO TABS: 25.0000 mg | ORAL_TABLET | Freq: Every day | ORAL | Status: DC

## 2015-03-01 MED ORDER — AMLODIPINE BESYLATE 5 MG PO TABS: 5.0000 mg | ORAL_TABLET | Freq: Every day | ORAL | Status: AC

## 2015-03-01 NOTE — Progress Notes (Signed)
CSW was notified by 1st shift EDCSW that [facility] will not be able to admit pt into their facility until APS confirms that they will be able to apply for special assistance medicaid for the pt.  CSW reached out to [place]. However, she did not answer the phone. CSW left a message asking for a call back.  Carmen/APS [phone]  Willette Brace [phone] ED CSW 03/01/2015 3:54 PM

## 2015-03-01 NOTE — ED Notes (Signed)
Psychiatry at bedside.

## 2015-03-01 NOTE — Progress Notes (Signed)
CSW left message for Edwin [name] at [place] at [place] left message for Edwin [name] at [facility] at [phone] at [place].   Friday, 02/26/2015, Tori stated that patient would be able to be admitted to alf on Monday.   Belia Heman, [place] Work  [employer] [phone]

## 2015-03-01 NOTE — Consult Note (Signed)
[facility] Psychiatry Consult   Reason for Consult:  UNSPECIFIED MOOD DISORDER, Dementia with aggressive behaviors Referring Physician:  EDP Patient Identification: Edwin [name] MRN:  [id number] Principal Diagnosis: Dementia with aggressive behavior Diagnosis:   Patient Active Problem List   Diagnosis Date Noted  . 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 Encounter for preadmission testing [Z01.818]   . Hyperglycemia [R73.9] 02/23/2015  . Renal insufficiency [N28.9] 02/23/2015  . DM [E11.9] 10/02/2007  . HYPERTENSION [I10] 10/02/2007  . 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 ALLERGIC RHINITIS [J30.9] 10/02/2007  . EMPHYSEMA [J43.8] 10/02/2007  . COPD [J44.9] 10/02/2007  . 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 HEADACHE, CHRONIC [R51] 10/02/2007  . HYPERGLYCEMIA [R73.09] 10/02/2007    Total Time spent with patient: 30 minutes  Subjective:   Edwin [name] is a 69 y.o. male patient admitted with dementia, aggressive behaviors.  HPI:  The patient has been calm and cooperative as long as his schedule is followed; ADLs in the afternoons and relaxation in the am.  Placement continues to be sought.  Patient will be transferred to [facility] today.  Patient has been accepted for admission to a nursing home.  HPI Elements:   Location:  Unspecified mood disorder. Quality:  unknown. Severity:  uta. Timing:  Acute. Duration:  UTA. Context:  UTA.  Past Medical History:  Past Medical History  Diagnosis Date  . COPD (chronic obstructive pulmonary disease)   . Diabetes mellitus without complication   . Chest pain   . Anxiety    History reviewed. No pertinent past surgical history. Family History:  Family History  Problem Relation Age of Onset  . Family history unknown: Yes   Social History:  History  Alcohol Use No     History  Drug Use No    History   Social History  . Marital Status: Single    Spouse Name: N/A  .  Number of Children: N/A  . Years of Education: N/A   Social History Main Topics  . Smoking status: Current Every Day Smoker    Types: Cigarettes  . Smokeless tobacco: Not on file  . Alcohol Use: No  . Drug Use: No  . Sexual Activity: Not on file   Other Topics Concern  . None   Social History Narrative   Additional Social History:    Pain Medications: UTA Prescriptions: UTA Over the Counter: UTA History of alcohol / drug use?: No history of alcohol / drug abuse Longest period of sobriety (when/how long): UTA                     Allergies:  No Known Allergies  Labs:  Results for orders placed or performed during the hospital encounter of 02/23/15 (from the past 48 hour(s))  CBG monitoring, ED     Status: Abnormal   Collection Time: 02/27/15  6:25 PM  Result Value Ref Range   Glucose-Capillary 172 (H) 70 - 99 mg/dL  CBG monitoring, ED     Status: Abnormal   Collection Time: 02/28/15  6:26 AM  Result Value Ref Range   Glucose-Capillary 249 (H) 70 - 99 mg/dL   Comment 1 Document in Chart    Comment 2 Repeat Test   CBG monitoring, ED     Status: Abnormal   Collection Time: 02/28/15 12:20 PM  Result Value Ref Range   Glucose-Capillary 344 (H) 70 - 99 mg/dL  CBG monitoring, ED     Status: Abnormal  Collection Time: 02/28/15  5:25 PM  Result Value Ref Range   Glucose-Capillary 131 (H) 70 - 99 mg/dL  CBG monitoring, ED     Status: Abnormal   Collection Time: 02/28/15  9:43 PM  Result Value Ref Range   Glucose-Capillary 304 (H) 70 - 99 mg/dL  CBG monitoring, ED     Status: Abnormal   Collection Time: 03/01/15  8:46 AM  Result Value Ref Range   Glucose-Capillary 276 (H) 70 - 99 mg/dL  CBG monitoring, ED     Status: Abnormal   Collection Time: 03/01/15 12:38 PM  Result Value Ref Range   Glucose-Capillary 219 (H) 70 - 99 mg/dL    Vitals: Blood pressure 116/96, pulse 96, temperature 98.5 F (36.9 C), temperature source Oral, resp. rate 12, weight 48.49 kg (106  lb 14.4 oz), SpO2 94 %.  Risk to Self: Suicidal Ideation:  (UTA) Suicidal Intent:  (UTA) Is patient at risk for suicide?:  (UTA) Suicidal Plan?:  (UTA) Access to Means:  (UTA) What has been your use of drugs/alcohol within the last 12 months?:  (UTA) How many times?:  (UTA) Other Self Harm Risks:  (UTA) Triggers for Past Attempts:  (UTA) Intentional Self Injurious Behavior:  (UTA) Risk to Others: Homicidal Ideation:  (UTA) Thoughts of Harm to Others:  (UTA) Current Homicidal Intent:  (UTA) Current Homicidal Plan:  (UTA) Access to Homicidal Means:  (UTA) Identified Victim:  (UTA) History of harm to others?:  (UTA) Assessment of Violence:  (UTA) Violent Behavior Description:  (UTA) Does patient have access to weapons?:  (UTA) Criminal Charges Pending?:  (UTA) Does patient have a court date:  Special educational needs teacher) Prior Inpatient Therapy: Prior Inpatient Therapy:  (UTA) Prior Outpatient Therapy: Prior Outpatient Therapy:  (UTA) Does patient have an ACCT team?: Unknown Does patient have Intensive In-House Services?  : Unknown Does patient have [facility] services? : Unknown Does patient have P4CC services?: Unknown  Current Facility-Administered Medications  Medication Dose Route Frequency Provider Last Rate Last Dose  . amLODipine (NORVASC) tablet 5 mg  5 mg Oral Daily Jani Gravel, MD   5 mg at 03/01/15 0956  . famotidine (PEPCID) tablet 20 mg  20 mg Oral BID Orpah Greek, MD   20 mg at 03/01/15 0955  . insulin aspart (novoLOG) injection 0-9 Units  0-9 Units Subcutaneous TID WC Fredia Sorrow, MD   3 Units at 03/01/15 1323  . metFORMIN (GLUCOPHAGE) tablet 500 mg  500 mg Oral BID WC Caren Griffins, MD   500 mg at 03/01/15 0858  . QUEtiapine (SEROQUEL) tablet 50 mg  50 mg Oral QHS [facility]      . sertraline (ZOLOFT) tablet 25 mg  25 mg Oral Daily Orpah Greek, MD   25 mg at 03/01/15 0956  . simvastatin (ZOCOR) tablet 20 mg  20 mg Oral QPM Orpah Greek, MD   20 mg at  02/28/15 1825  . triamterene-hydrochlorothiazide (MAXZIDE-25) 37.5-25 MG per tablet 0.5 tablet  0.5 tablet Oral Daily Orpah Greek, MD   0.5 tablet at 03/01/15 0354   Current Outpatient Prescriptions  Medication Sig Dispense Refill  . amLODipine (NORVASC) 5 MG tablet Take 1 tablet (5 mg total) by mouth daily. 30 tablet 0  . famotidine (PEPCID) 20 MG tablet Take 1 tablet (20 mg total) by mouth 2 (two) times daily. 30 tablet 0  . metFORMIN (GLUCOPHAGE) 500 MG tablet Take 1 tablet (500 mg total) by mouth 2 (two) times daily with a meal.  60 tablet 0  . predniSONE (DELTASONE) 20 MG tablet 3 tabs po day one, then 2 tabs daily x 4 days (Patient not taking: Reported on 02/23/2015) 11 tablet 0  . predniSONE (STERAPRED UNI-PAK) 10 MG tablet Take 1 tablet (10 mg total) by mouth daily. Day 1: take 6 tabs.  Day 2: 5 tabs  Day 3: 4 tabs  Day 4: 3 tabs  Day 5: 2 tabs  Day 6: 1 tab (Patient not taking: Reported on 02/23/2015) 21 tablet 0  . QUEtiapine (SEROQUEL) 50 MG tablet Take 1 tablet (50 mg total) by mouth at bedtime. 30 tablet 0  . sertraline (ZOLOFT) 25 MG tablet Take 1 tablet (25 mg total) by mouth daily. 30 tablet 0  . simvastatin (ZOCOR) 20 MG tablet Take 1 tablet (20 mg total) by mouth every evening. 30 tablet 0  . triamterene-hydrochlorothiazide (MAXZIDE-25) 37.5-25 MG per tablet Take 0.5 tablets by mouth daily. 30 tablet 0    Unable to perform review of system due to patient refusal to participate in assessment, non verbal.  Musculoskeletal: Strength & Muscle Tone: WDL Gait & Station: WDL Patient leans: NA  Psychiatric Specialty Exam:     Blood pressure 116/96, pulse 96, temperature 98.5 F (36.9 C), temperature source Oral, resp. rate 12, weight 48.49 kg (106 lb 14.4 oz), SpO2 94 %.There is no height on file to calculate BMI.  General Appearance: Casual  Eye Contact::  None  Speech:  Slow  Volume:  Decreased  Mood:  Irritable  Affect:  Congruent  Thought Process:  uta   Orientation:  Other:  uta  Thought Content:  uta  Suicidal Thoughts:  uta  Homicidal Thoughts:  uta  Memory:  uta  Judgement:  Other:  uta  Insight:  uta  Psychomotor Activity:  uta  Concentration:  uta  Recall:  Daphene Jaeger of Knowledge:uta  Language: uta  Akathisia:  NA  Handed:  Right  AIMS (if indicated):     Assets:  Others:  uta  ADL's:  Impaired  Cognition: Impaired,  Moderate  Sleep:      Medical Decision Making: Established Problem, Stable/Improving (1)  Treatment Plan Summary: Trazodone will be discontinued and Seroquel 50 mg at bed time is started.  Plan: Discharge  To [facility] home today.  Delfin Gant   PMHNP-BC 03/01/2015 2:53 PM Patient seen face-to-face for psychiatric evaluation, chart reviewed and case discussed with the physician extender and developed treatment plan. Reviewed the information documented and agree with the treatment plan. Corena Pilgrim, MD

## 2015-03-01 NOTE — ED Notes (Signed)
IVC rescinded

## 2015-03-01 NOTE — Progress Notes (Signed)
CSW reached out to pt sister, [phone] to assist with paperwork at [facility] for placement and left message.   CSW reached out to [place] to assist with pt medicaid special assistance application.   CSW spoke with Tori at [facility] who is attempting to get patient signed in with assistance from sister. If sister can sign patient in today, patient can come today. Per psychiatry patient would not be able to complete admission paperwork for alf.   Belia Heman, [place] Work  [employer] [phone]

## 2015-03-01 NOTE — ED Notes (Signed)
Patient has rested comfortably this evening, awakening twice to use the restroom.  No signs of distress noted.  Denies needs.

## 2015-03-01 NOTE — ED Notes (Signed)
Resting in bed with eyes closed, snoring softly.  No signs of distress noted.  Will continue to monitor.

## 2015-03-02 DIAGNOSIS — R627 Adult failure to thrive: Secondary | ICD-10-CM | POA: Diagnosis not present

## 2015-03-02 DIAGNOSIS — F0391 Unspecified dementia with behavioral disturbance: Secondary | ICD-10-CM | POA: Diagnosis not present

## 2015-03-02 LAB — CBG MONITORING, ED
GLUCOSE-CAPILLARY: 230 mg/dL — AB (ref 70–99)
Glucose-Capillary: 233 mg/dL — ABNORMAL HIGH (ref 70–99)
Glucose-Capillary: 294 mg/dL — ABNORMAL HIGH (ref 70–99)

## 2015-03-02 MED ORDER — INSULIN ASPART 100 UNIT/ML ~~LOC~~ SOLN: 0.0000 [IU] | Freq: Three times a day (TID) | SUBCUTANEOUS | Status: DC

## 2015-03-02 NOTE — ED Notes (Signed)
Received Report from [place]  Pt sleeping in recliner. Rise and fall of chest noted. Pt was not disturbed by this RN and allowed to rest.  Pt in view of nursing station.

## 2015-03-02 NOTE — Discharge Instructions (Signed)
Dementia Dementia is a word that is used to describe problems with the brain and how it works. People with dementia have memory loss. They may also have problems with thinking, speaking, or solving problems. It can affect how they act around people, how they do their job, their mood, and their personality. These changes may not show up for a long time. Family or friends may not notice problems in the early part of this disease. HOME CARE The following tips are for the person living with, or caring for, the person with dementia. Make the home safe.  Remove locks on bathroom doors.  Use childproof locks on cabinets where alcohol, cleaning supplies, or chemicals are stored.  Put outlet covers in electrical outlets.  Put in childproof locks to keep doors and windows safe.  Remove stove knobs, or put in safety knobs that shut off on their own.  Lower the temperature on water heaters.  Label medicines. Lock them in a safe place.  Keep knives, lighters, matches, power tools, and guns out of reach or in a safe place.  Remove objects that might break or can hurt the person.  Make sure lighting is good inside and outside.  Put in grab bars if needed.  Use a device that detects falls or other needs for help. Lessen confusion.  Keep familiar objects and people around.  Use night lights or low lit (dim) lights at night.  Label objects or areas.  Use reminders, notes, or directions for daily activities or tasks.  Keep a simple routine that is the same for waking, meals, bathing, dressing, and bedtime.  Create a calm and quiet home.  Put up clocks and calendars.  Keep emergency numbers and the home address near all phones.  Help show the different times of day. Open the curtains during the day to let light in. Speak clearly and directly.  Choose simple words and short sentences.  Use a gentle, calm voice.  Do not interrupt.  If the person has a hard time finding a word to  use, give them the word or thought.  Ask 1 question at a time. Give enough time for the person to answer. Repeat the question if the person does not answer. Do things that lessen restlessness.  Provide a comfortable bed.  Have the same bedtime routine every night.  Have a regular walking and activity schedule.  Lessen naps during the day.  Do not let the person drink a lot of caffeine.  Go to events that are not overwhelming. Eat well and drink fluids.  Lessen distractions during meal times and snacks.  Avoid foods that are too hot or too cold.  Watch how the person chews and swallows. This is to make sure they do not choke. Other  Keep all vision, hearing, dental, and medical visits with the doctor.  Only give medicines as told by the doctor.  Watch the person's driving ability. Do not let the person drive if he or she cannot drive safely.  Use a program that helps find a person if they become missing. You may need to register with this program. GET HELP RIGHT AWAY IF:   A fever of 102 F (38.9 C) develops.  Confusion develops or gets worse.  Sleepiness develops or gets worse.  Staying awake is hard to do.  New behavior problems start like mood swings, aggression, and seeing things that are not there.  Problems with balance, speech, or falling develop.  Problems swallowing develop.  Any  problems of another sickness develop. MAKE SURE YOU:  Understand these instructions.  Will watch his or her condition.  Will get help right away if he or she is not doing well or gets worse. Document Released: 09/21/2008 Document Revised: 01/01/2012 Document Reviewed: 03/06/2011 [facility] Patient Information 2015 [place], [place]. This information is not intended to replace advice given to you by your health care provider. Make sure you discuss any questions you have with your health care provider.

## 2015-03-02 NOTE — ED Notes (Signed)
Pt ambulated to restroom. 

## 2015-03-02 NOTE — Progress Notes (Signed)
Pt accepted to [facility]. Patient sister completed admission paperwork, pt special assistance medicaid, and confirmed that medicaid worker is Ms. Marina Gravel [phone]. CSW confirmed with Herbert Spires and Jeniffer that patient is ready for admission. CSW attempted to fax paperwork to [phone] and [phone]. Tori called and stated that patient can come with paperwork and that it was ok that fax was not completed. RN can call report to [phone]. Pt to be transported by EMS. CSW set up transfer by ptar as agreed upon with nurse.   Belia Heman, [place] Work  [employer] [phone]

## 2015-03-02 NOTE — Progress Notes (Signed)
   03/02/15 0900  PT Visit Information  Last PT Received On 03/02/15  Reason Eval/Treat Not Completed Patient declined, patient states his feet hurt. Encouraged  Patient but he would not get up to walk.  Tresa Endo PT [phone]

## 2015-03-02 NOTE — ED Notes (Signed)
Patient refused to lie in bed, requested to sit in chair.

## 2015-03-02 NOTE — Consult Note (Signed)
[facility] Consult   Reason for Consult:  UNSPECIFIED MOOD DISORDER, Dementia with aggressive behaviors Referring Physician:  EDP Patient Identification: Edwin [name] MRN:  [id number] Principal Diagnosis: Dementia with aggressive behavior Diagnosis:   Patient Active Problem List   Diagnosis Date Noted  . 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 Hyperglycemia [R73.9] 02/23/2015    Priority: High  . Renal insufficiency [N28.9] 02/23/2015    Priority: High  . Failure to thrive in adult [R62.7]   . Encounter for preadmission testing [Z01.818]   . DM [E11.9] 10/02/2007  . HYPERTENSION [I10] 10/02/2007  . 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 ALLERGIC RHINITIS [J30.9] 10/02/2007  . EMPHYSEMA [J43.8] 10/02/2007  . COPD [J44.9] 10/02/2007  . 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 HEADACHE, CHRONIC [R51] 10/02/2007  . HYPERGLYCEMIA [R73.09] 10/02/2007    Total Time spent with patient: 30 minutes  Subjective:   Edwin [name] is a 69 y.o. male patient admitted with dementia, aggressive behaviors.  HPI:  The patient should transfer to [facility], assisted living facility, for care.  Rx given.  HPI Elements:   Location:  Unspecified mood disorder. Quality:  unknown. Severity:  uta. Timing:  Acute. Duration:  UTA. Context:  UTA.  Past Medical History:  Past Medical History  Diagnosis Date  . COPD (chronic obstructive pulmonary disease)   . Diabetes mellitus without complication   . Chest pain   . Anxiety    History reviewed. No pertinent past surgical history. Family History:  Family History  Problem Relation Age of Onset  . Family history unknown: Yes   Social History:  History  Alcohol Use No     History  Drug Use No    History   Social History  . Marital Status: Single    Spouse Name: N/A  . Number of Children: N/A  . Years of Education: N/A   Social History Main Topics  . Smoking  status: Current Every Day Smoker    Types: Cigarettes  . Smokeless tobacco: Not on file  . Alcohol Use: No  . Drug Use: No  . Sexual Activity: Not on file   Other Topics Concern  . None   Social History Narrative   Additional Social History:    Pain Medications: UTA Prescriptions: UTA Over the Counter: UTA History of alcohol / drug use?: No history of alcohol / drug abuse Longest period of sobriety (when/how long): UTA                     Allergies:  No Known Allergies  Labs:  Results for orders placed or performed during the hospital encounter of 02/23/15 (from the past 48 hour(s))  CBG monitoring, ED     Status: Abnormal   Collection Time: 02/28/15 12:20 PM  Result Value Ref Range   Glucose-Capillary 344 (H) 70 - 99 mg/dL  CBG monitoring, ED     Status: Abnormal   Collection Time: 02/28/15  5:25 PM  Result Value Ref Range   Glucose-Capillary 131 (H) 70 - 99 mg/dL  CBG monitoring, ED     Status: Abnormal   Collection Time: 02/28/15  9:43 PM  Result Value Ref Range   Glucose-Capillary 304 (H) 70 - 99 mg/dL  CBG monitoring, ED     Status: Abnormal   Collection Time: 03/01/15  8:46 AM  Result Value Ref Range   Glucose-Capillary 276 (H) 70 - 99 mg/dL  CBG monitoring, ED  Status: Abnormal   Collection Time: 03/01/15 12:38 PM  Result Value Ref Range   Glucose-Capillary 219 (H) 70 - 99 mg/dL  CBG monitoring, ED     Status: Abnormal   Collection Time: 03/01/15  6:27 PM  Result Value Ref Range   Glucose-Capillary 104 (H) 70 - 99 mg/dL  CBG monitoring, ED     Status: Abnormal   Collection Time: 03/02/15  8:23 AM  Result Value Ref Range   Glucose-Capillary 230 (H) 70 - 99 mg/dL    Vitals: Blood pressure 146/100, pulse 60, temperature 98.1 F (36.7 C), temperature source Oral, resp. rate 18, weight 106 lb 14.4 oz (48.49 kg), SpO2 95 %.  Risk to Self: Suicidal Ideation:  (UTA) Suicidal Intent:  (UTA) Is patient at risk for suicide?:  (UTA) Suicidal Plan?:   (UTA) Access to Means:  (UTA) What has been your use of drugs/alcohol within the last 12 months?:  (UTA) How many times?:  (UTA) Other Self Harm Risks:  (UTA) Triggers for Past Attempts:  (UTA) Intentional Self Injurious Behavior:  (UTA) Risk to Others: Homicidal Ideation:  (UTA) Thoughts of Harm to Others:  (UTA) Current Homicidal Intent:  (UTA) Current Homicidal Plan:  (UTA) Access to Homicidal Means:  (UTA) Identified Victim:  (UTA) History of harm to others?:  (UTA) Assessment of Violence:  (UTA) Violent Behavior Description:  (UTA) Does patient have access to weapons?:  (UTA) Criminal Charges Pending?:  (UTA) Does patient have a court date:  Special educational needs teacher) Prior Inpatient Therapy: Prior Inpatient Therapy:  (UTA) Prior Outpatient Therapy: Prior Outpatient Therapy:  (UTA) Does patient have an ACCT team?: Unknown Does patient have Intensive In-House Services?  : Unknown Does patient have [facility] services? : Unknown Does patient have [facility] services?: Unknown  Current Facility-Administered Medications  Medication Dose Route Frequency Provider Last Rate Last Dose  . amLODipine (NORVASC) tablet 5 mg  5 mg Oral Daily Jani Gravel, MD   5 mg at 03/02/15 0841  . famotidine (PEPCID) tablet 20 mg  20 mg Oral BID Orpah Greek, MD   20 mg at 03/02/15 0840  . insulin aspart (novoLOG) injection 0-9 Units  0-9 Units Subcutaneous TID WC Patrecia Pour, NP   3 Units at 03/02/15 [phone]  . metFORMIN (GLUCOPHAGE) tablet 500 mg  500 mg Oral BID WC Caren Griffins, MD   500 mg at 03/02/15 0840  . QUEtiapine (SEROQUEL) tablet 50 mg  50 mg Oral QHS Dameka Younker   50 mg at 03/01/15 2122  . sertraline (ZOLOFT) tablet 25 mg  25 mg Oral Daily Orpah Greek, MD   25 mg at 03/02/15 0840  . simvastatin (ZOCOR) tablet 20 mg  20 mg Oral QPM Orpah Greek, MD   20 mg at 02/28/15 1825  . triamterene-hydrochlorothiazide (MAXZIDE-25) 37.5-25 MG per tablet 0.5 tablet  0.5 tablet Oral Daily Orpah Greek, MD   0.5 tablet at 03/02/15 0840   Current Outpatient Prescriptions  Medication Sig Dispense Refill  . amLODipine (NORVASC) 5 MG tablet Take 1 tablet (5 mg total) by mouth daily. 30 tablet 0  . famotidine (PEPCID) 20 MG tablet Take 1 tablet (20 mg total) by mouth 2 (two) times daily. 30 tablet 0  . insulin aspart (NOVOLOG) 100 UNIT/ML injection Inject 0-9 Units into the skin 3 (three) times daily with meals. 10 mL 11  . metFORMIN (GLUCOPHAGE) 500 MG tablet Take 1 tablet (500 mg total) by mouth 2 (two) times daily with a meal. 60  tablet 0  . predniSONE (DELTASONE) 20 MG tablet 3 tabs po day one, then 2 tabs daily x 4 days (Patient not taking: Reported on 02/23/2015) 11 tablet 0  . predniSONE (STERAPRED UNI-PAK) 10 MG tablet Take 1 tablet (10 mg total) by mouth daily. Day 1: take 6 tabs.  Day 2: 5 tabs  Day 3: 4 tabs  Day 4: 3 tabs  Day 5: 2 tabs  Day 6: 1 tab (Patient not taking: Reported on 02/23/2015) 21 tablet 0  . QUEtiapine (SEROQUEL) 50 MG tablet Take 1 tablet (50 mg total) by mouth at bedtime. 30 tablet 0  . sertraline (ZOLOFT) 25 MG tablet Take 1 tablet (25 mg total) by mouth daily. 30 tablet 0  . simvastatin (ZOCOR) 20 MG tablet Take 1 tablet (20 mg total) by mouth every evening. 30 tablet 0  . triamterene-hydrochlorothiazide (MAXZIDE-25) 37.5-25 MG per tablet Take 0.5 tablets by mouth daily. 30 tablet 0    Unable to perform review of system due to patient refusal to participate in assessment, non verbal.  Musculoskeletal: Strength & Muscle Tone: WDL Gait & Station: WDL Patient leans: NA  Psychiatric Specialty Exam:     Blood pressure 146/100, pulse 60, temperature 98.1 F (36.7 C), temperature source Oral, resp. rate 18, weight 106 lb 14.4 oz (48.49 kg), SpO2 95 %.There is no height on file to calculate BMI.  General Appearance: Casual and Disheveled  Eye Contact::  None  Speech:  Slow and "Used I don't know"  Volume:  Decreased  Mood:  Irritable  Affect:  Congruent   Thought Process:  uta  Orientation:  Other:  uta  Thought Content:  uta  Suicidal Thoughts:  uta  Homicidal Thoughts:  uta  Memory:  uta  Judgement:  Other:  uta  Insight:  uta  Psychomotor Activity:  uta  Concentration:  uta  Recall:  uta  Fund of Knowledge:uta  Language: uta  Akathisia:  NA  Handed:  Right  AIMS (if indicated):     Assets:  Others:  uta  ADL's:  Impaired  Cognition: Impaired,  Moderate  Sleep:      Medical Decision Making: Review of Psycho-Social Stressors (1), Established Problem, Worsening (2), Review of Medication Regimen & Side Effects (2) and Review of New Medication or Change in Dosage (2)  Treatment Plan Summary: Daily contact with patient to assess and evaluate symptoms and progress in treatment, Medication management and Plan Disposition to be determined later since SW is involved.  We have started Trazodone 50 mg qhs for sleep  and Sertraline 25 mg bo daily for depression.  Plan:  Based on information gathered by SW we are now looking at Assisted living facility or Nursing home for patient.  Patient is having difficulty managing his ADLS, he  He is not able to feed himself his full meal.  Psychiatry will continue to round on patient and make changes to his medication as needed.  Meanwhile we will continue  Offering  the above medications. Disposition: Patient should transfer to [facility], assisted living facility.  Waylan Boga   PMHNP-BC 03/02/2015 11:56 AM Patient seen face-to-face for psychiatric evaluation, chart reviewed and case discussed with the physician extender and developed treatment plan. Reviewed the information documented and agree with the treatment plan. Corena Pilgrim, MD

## 2015-03-02 NOTE — ED Notes (Signed)
Patient decided to lay in bed. Blanket provided. Pt comfortable. VSS

## 2015-03-02 NOTE — [facility] Suicide Risk Assessment (Signed)
Suicide Risk Assessment  Discharge Assessment   [facility] Discharge Suicide Risk Assessment   Demographic Factors:  Male and Age 69 or older  Total Time spent with patient: 30 minutes  Musculoskeletal: Strength & Muscle Tone: WDL Gait & Station: WDL Patient leans: NA  Psychiatric Specialty Exam:     Blood pressure 146/100, pulse 60, temperature 98.1 F (36.7 C), temperature source Oral, resp. rate 18, weight 106 lb 14.4 oz (48.49 kg), SpO2 95 %.There is no height on file to calculate BMI.  General Appearance: Casual and Disheveled  Eye Contact:: None  Speech: Slow and "Used I don't know"  Volume: Decreased  Mood: Irritable  Affect: Congruent  Thought Process: uta  Orientation: Other: uta  Thought Content: uta  Suicidal Thoughts: uta  Homicidal Thoughts: uta  Memory: uta  Judgement: Other: uta  Insight: uta  Psychomotor Activity: uta  Concentration: uta  Recall: Daphene Jaeger of Knowledge:uta  Language: uta  Akathisia: NA  Handed: Right  AIMS (if indicated):    Assets: Others: uta  ADL's: Impaired  Cognition: Impaired, Moderate  Sleep:         Has this patient used any form of tobacco in the last 30 days? (Cigarettes, Smokeless Tobacco, Cigars, and/or Pipes) No  Mental Status Per Nursing Assessment::   On Admission:   Aggression, dementia  Current Mental Status by Physician: NA  Loss Factors: NA  Historical Factors: NA  Risk Reduction Factors:   Positive social support  Continued Clinical Symptoms:  Dementia   Cognitive Features That Contribute To Risk:  Loss of executive function    Suicide Risk:  Minimal: No identifiable suicidal ideation.  Patients presenting with no risk factors but with morbid ruminations; may be classified as minimal risk based on the severity of the depressive symptoms  Principal Problem: Dementia with aggressive behavior Discharge Diagnoses:  Patient Active Problem List    Diagnosis Date Noted  . 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 Hyperglycemia [R73.9] 02/23/2015    Priority: High  . Renal insufficiency [N28.9] 02/23/2015    Priority: High  . Failure to thrive in adult [R62.7]   . Encounter for preadmission testing [Z01.818]   . DM [E11.9] 10/02/2007  . HYPERTENSION [I10] 10/02/2007  . 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 ALLERGIC RHINITIS [J30.9] 10/02/2007  . EMPHYSEMA [J43.8] 10/02/2007  . COPD [J44.9] 10/02/2007  . 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 HEADACHE, CHRONIC [R51] 10/02/2007  . HYPERGLYCEMIA [R73.09] 10/02/2007      Plan Of Care/Follow-up recommendations:  Activity:  as tolerated Diet:  heart healthy diet  Is patient on multiple antipsychotic therapies at discharge:  No   Has Patient had three or more failed trials of antipsychotic monotherapy by history:  No  Recommended Plan for Multiple Antipsychotic Therapies: NA    Geral Coker, PMH-NP 03/02/2015, 11:59 AM

## 2015-08-27 ENCOUNTER — Encounter (HOSPITAL_COMMUNITY): Payer: Self-pay | Admitting: *Deleted

## 2015-08-27 ENCOUNTER — Emergency Department (HOSPITAL_COMMUNITY)
Admission: EM | Admit: 2015-08-27 | Discharge: 2015-08-27 | Disposition: A | Discharge status: Home / Self Care | Payer: Medicare Other | Attending: Emergency Medicine | Admitting: Emergency Medicine

## 2015-08-27 DIAGNOSIS — Z7982 Long term (current) use of aspirin: Secondary | ICD-10-CM | POA: Insufficient documentation

## 2015-08-27 DIAGNOSIS — Z720 Tobacco use: Secondary | ICD-10-CM | POA: Insufficient documentation

## 2015-08-27 DIAGNOSIS — J449 Chronic obstructive pulmonary disease, unspecified: Secondary | ICD-10-CM | POA: Insufficient documentation

## 2015-08-27 DIAGNOSIS — Z794 Long term (current) use of insulin: Secondary | ICD-10-CM | POA: Diagnosis not present

## 2015-08-27 DIAGNOSIS — E119 Type 2 diabetes mellitus without complications: Secondary | ICD-10-CM | POA: Diagnosis not present

## 2015-08-27 DIAGNOSIS — R21 Rash and other nonspecific skin eruption: Secondary | ICD-10-CM | POA: Diagnosis present

## 2015-08-27 DIAGNOSIS — Z79899 Other long term (current) drug therapy: Secondary | ICD-10-CM | POA: Insufficient documentation

## 2015-08-27 DIAGNOSIS — B86 Scabies: Secondary | ICD-10-CM | POA: Insufficient documentation

## 2015-08-27 DIAGNOSIS — F419 Anxiety disorder, unspecified: Secondary | ICD-10-CM | POA: Insufficient documentation

## 2015-08-27 LAB — I-STAT CHEM 8, ED
BUN: 24 mg/dL — AB (ref 6–20)
CALCIUM ION: 1.14 mmol/L (ref 1.13–1.30)
CHLORIDE: 100 mmol/L — AB (ref 101–111)
Creatinine, Ser: 1 mg/dL (ref 0.61–1.24)
Glucose, Bld: 145 mg/dL — ABNORMAL HIGH (ref 65–99)
HCT: 42 % (ref 39.0–52.0)
Hemoglobin: 14.3 g/dL (ref 13.0–17.0)
POTASSIUM: 3.7 mmol/L (ref 3.5–5.1)
Sodium: 143 mmol/L (ref 135–145)
TCO2: 30 mmol/L (ref 0–100)

## 2015-08-27 MED ORDER — PERMETHRIN 5 % EX CREA: TOPICAL_CREAM | CUTANEOUS | Status: DC

## 2015-08-27 NOTE — Discharge Instructions (Signed)
Please use permethrim cream. Apply it on him for 6 hrs then wash him off.  His close contacts and roommates need treatment as well.   Wash all sheets and clothes.   See your doctor.  Return to ER if he has worse rash, fever, dehydration.    Scabies, Adult Scabies is a skin condition that happens when very small insects get under the skin (infestation). This causes a rash and severe itchiness. Scabies can spread from person to person (is contagious). If you get scabies, it is common for others in your household to get scabies too. With proper treatment, symptoms usually go away in 2-4 weeks. Scabies usually does not cause lasting problems. CAUSES This condition is caused by mites (Sarcoptes scabiei, or human itch mites) that can only be seen with a microscope. The mites get into the top layer of skin and lay eggs. Scabies can spread from person to person through:  Close contact with a person who has scabies.  Contact with infested items, such as towels, bedding, or clothing. RISK FACTORS This condition is more likely to develop in:  People who live in nursing homes and other extended-care facilities.  People who have sexual contact with a partner who has scabies.  Young children who attend child care facilities.  People who care for others who are at increased risk for scabies. SYMPTOMS Symptoms of this condition may include:  Severe itchiness. This is often worse at night.  A rash that includes tiny red bumps or blisters. The rash commonly occurs on the wrist, elbow, armpit, fingers, waist, groin, or buttocks. Bumps may form a line (burrow) in some areas.  Skin irritation. This can include scaly patches or sores. DIAGNOSIS This condition is diagnosed with a physical exam. Your health care provider will look closely at your skin. In some cases, your health care provider may take a sample of your affected skin (skin scraping) and have it examined under a  microscope. TREATMENT This condition may be treated with:  Medicated cream or lotion that kills the mites. This is spread on the entire body and left on for several hours. Usually, one treatment with medicated cream or lotion is enough to kill all of the mites. In severe cases, the treatment may be repeated.  Medicated cream that relieves itching.  Medicines that help to relieve itching.  Medicines that kill the mites. This treatment is rarely used. HOME CARE INSTRUCTIONS Medicines  Take or apply over-the-counter and prescription medicines as told by your health care provider.  Apply medicated cream or lotion as told by your health care provider.  Do not wash off the medicated cream or lotion until the necessary amount of time has passed. Skin Care  Avoid scratching your affected skin.  Keep your fingernails closely trimmed to reduce injury from scratching.  Take cool baths or apply cool washcloths to help reduce itching. General Instructions  Clean all items that you recently had contact with, including bedding, clothing, and furniture. Do this on the same day that your treatment starts.  Use hot water when you wash items.  Place unwashable items into closed, airtight plastic bags for at least 3 days. The mites cannot live for more than 3 days away from human skin.  Vacuum furniture and mattresses that you use.  Make sure that other people who may have been infested are examined by a health care provider. These include members of your household and anyone who may have had contact with infested items.  Keep  all follow-up visits as told by your health care provider. This is important. SEEK MEDICAL CARE IF:  You have itching that does not go away after 4 weeks of treatment.  You continue to develop new bumps or burrows.  You have redness, swelling, or pain in your rash area after treatment.  You have fluid, blood, or pus coming from your rash.   This information is not  intended to replace advice given to you by your health care provider. Make sure you discuss any questions you have with your health care provider.   Document Released: 06/30/2015 Document Reviewed: 05/11/2015 Elsevier Interactive Patient Education [employer].

## 2015-08-27 NOTE — ED Notes (Addendum)
Patient is from a skilled facility that has a known outbreak of bed bugs and scabies. The patient's room mate is also in the department with the same symptoms. Patient states that the bumps and itching "comes up every night". He has a soft raised mass to the posterior neck with multiple open sores that appear to have been scratched to the back, legs and torso. The patient has very dry skin all over with some significantly scattered dry patches to the axilla areas.

## 2015-08-27 NOTE — ED Notes (Signed)
Bed: WA09 Expected date:  Expected time:  Means of arrival:  Comments: Ems- ? Scabies

## 2015-08-27 NOTE — ED Provider Notes (Signed)
CSN: [id number]     Arrival date & time 08/27/15  0801 History   First MD Initiated Contact with Patient 08/27/15 [phone]     Chief Complaint  Patient presents with  . Rash  . Sore     (Consider location/radiation/quality/duration/timing/severity/associated sxs/prior Treatment) The history is provided by the patient, the EMS personnel and the nursing home.  SOTA HETZ is a 69 y.o. male hx of COPD, DM, dementia from [facility] home here with possible scabies. Patient states that there may be bed bugs and scabies at the nursing home. Has a roommate with similar symptoms. States that he usually wakes up in the morning with some bumps in his hand and feels very itchy. Denies any fevers or chills. States that he has dry skin. He normally doesn't eat that much but denies any vomiting or fevers. Demented at baseline and unable give much history.   Past Medical History  Diagnosis Date  . COPD (chronic obstructive pulmonary disease) ([place])   . Diabetes mellitus without complication ([place])   . Chest pain   . Anxiety    History reviewed. No pertinent past surgical history. Family History  Problem Relation Age of Onset  . Family history unknown: Yes   Social History  Substance Use Topics  . Smoking status: Current Every Day Smoker    Types: Cigarettes  . Smokeless tobacco: None  . Alcohol Use: No    Review of Systems  Skin: Positive for rash.  All other systems reviewed and are negative.     Allergies  Review of patient's allergies indicates no known allergies.  Home Medications   Prior to Admission medications   Medication Sig Start Date End Date Taking? Authorizing Provider  amLODipine (NORVASC) 5 MG tablet Take 1 tablet (5 mg total) by mouth daily. 03/01/15  Yes Delfin Gant, NP  aspirin 81 MG chewable tablet Chew 81 mg by mouth daily.   Yes Historical Provider, MD  famotidine (PEPCID) 20 MG tablet Take 1 tablet (20 mg total) by mouth 2 (two) times daily. 03/01/15   Yes Delfin Gant, NP  guaifenesin (ROBITUSSIN) 100 MG/5ML syrup Take 200 mg by mouth 3 (three) times daily.   Yes Historical Provider, MD  insulin aspart (NOVOLOG) 100 UNIT/ML injection Inject 0-9 Units into the skin 3 (three) times daily with meals. Patient taking differently: Inject 0-9 Units into the skin 3 (three) times daily with meals. Per Sliding Scale: 70-120:  0 units 121-150: 1 unit 150-200: 2 units 201-250: 3 units 251-300: 5 units 301-350: 7 units 351-400: 9 units 400 or greater: Call MD 03/02/15  Yes Patrecia Pour, NP  metFORMIN (GLUCOPHAGE) 500 MG tablet Take 1 tablet (500 mg total) by mouth 2 (two) times daily with a meal. Patient taking differently: Take 750 mg by mouth 2 (two) times daily with a meal.  03/01/15  Yes Delfin Gant, NP  mirtazapine (REMERON) 7.5 MG tablet Take 7.5 mg by mouth at bedtime.   Yes Historical Provider, MD  Nutritional Supplements (NUTRITIONAL SHAKE) LIQD Take 1 each by mouth 3 (three) times daily.   Yes Historical Provider, MD  QUEtiapine (SEROQUEL) 50 MG tablet Take 1 tablet (50 mg total) by mouth at bedtime. 03/01/15  Yes Delfin Gant, NP  sertraline (ZOLOFT) 25 MG tablet Take 1 tablet (25 mg total) by mouth daily. Patient taking differently: Take 75 mg by mouth daily.  03/01/15  Yes Delfin Gant, NP  simvastatin (ZOCOR) 20 MG tablet Take 1  tablet (20 mg total) by mouth every evening. 03/01/15  Yes Delfin Gant, NP  triamterene-hydrochlorothiazide (MAXZIDE-25) 37.5-25 MG per tablet Take 0.5 tablets by mouth daily. 03/01/15  Yes Delfin Gant, NP   BP 166/104 mmHg  Pulse 89  Temp(Src) 97.7 F (36.5 C) (Oral)  Resp 18  SpO2 96% Physical Exam  Constitutional:  Chronically ill   HENT:  Head: Normocephalic.  MM slightly dry   Eyes: Conjunctivae are normal. Pupils are equal, round, and reactive to light.  Neck: Normal range of motion. Neck supple.  Cardiovascular: Normal rate, regular rhythm and normal heart sounds.    Pulmonary/Chest: Effort normal and breath sounds normal. No respiratory distress. He has no wheezes. He has no rales.  Abdominal: Soft. Bowel sounds are normal. He exhibits no distension. There is no tenderness. There is no rebound.  Musculoskeletal: Normal range of motion.  Neurological: He is alert.  Skin:  Vesicular lesions on web spaces on hands. Dry skin overall with scratch marks and small macular papular lesions on torso. No obvious petechiae or purpura   Nursing note and vitals reviewed.   ED Course  Procedures (including critical care time) Labs Review Labs Reviewed  I-STAT CHEM 8, ED - Abnormal; Notable for the following:    Chloride 100 (*)    BUN 24 (*)    Glucose, Bld 145 (*)    All other components within normal limits    Imaging Review No results found. I have personally reviewed and evaluated these images and lab results as part of my medical decision-making.   EKG Interpretation None      MDM   Final diagnoses:  Scabies    ISAO SELTZER is a 69 y.o. male here with rash. Likely scabies. Appears slightly dehydrated but has failure to thrive at baseline so will get chem 8. If chem 8 at baseline can be discharged with permethrim cream. Room mate will need treatment as well.   10:28 AM Labs at baseline. Will give permethrim cream.     Wandra Arthurs, MD 08/27/15 1029

## 2016-08-03 IMAGING — CT CT HEAD W/O CM
2 of 3 series · 16 of 30 positions shown, 19 images · non-contrast
Comparison: None.

CLINICAL DATA: Altered mental status/ confusion.  Hyperglycemia

EXAM:
CT HEAD WITHOUT CONTRAST
TECHNIQUE: Contiguous axial images were obtained from the base of the skull
through the vertex without intravenous contrast.

[Series 3: head w/o · axial · non-contrast · 0.45mm/px · z∈[+1269,+1389]mm · 10 of 30 slices shown, 13 images]
[im 3/30  brain]
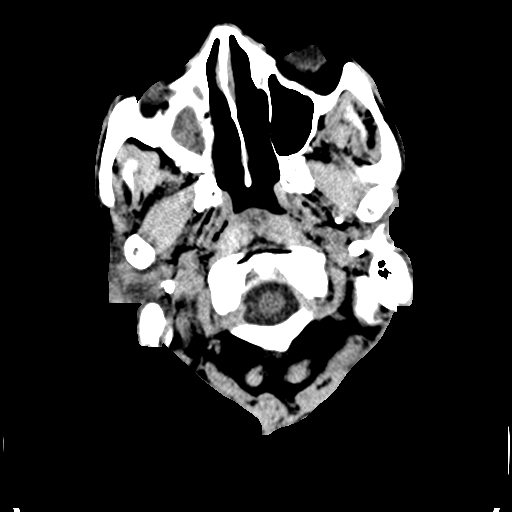
[im 3/30  bone]
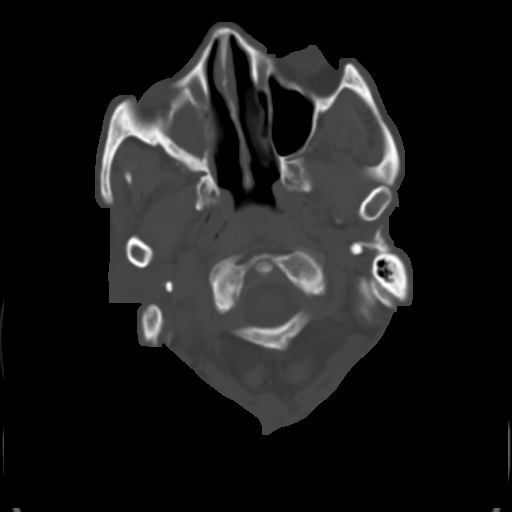
[im 6/30  brain]
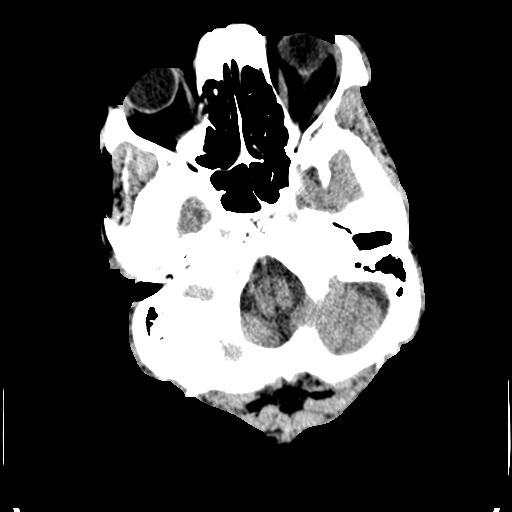
[im 8/30  brain]
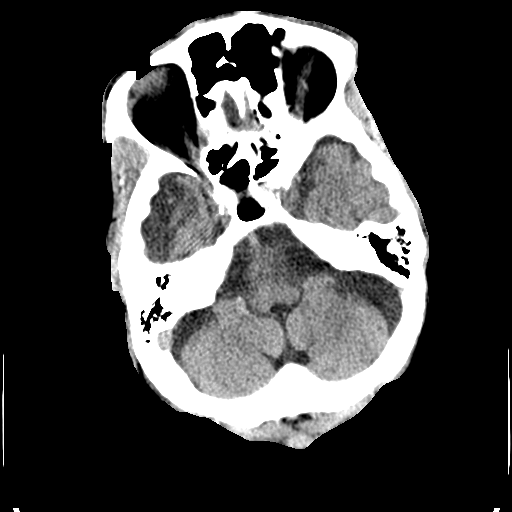
[im 11/30  brain]
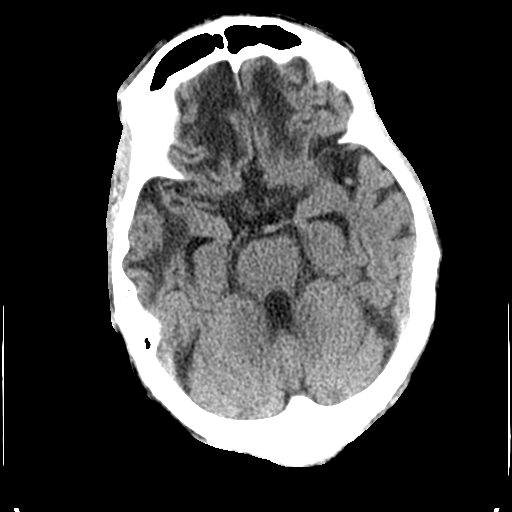
[im 14/30  brain]
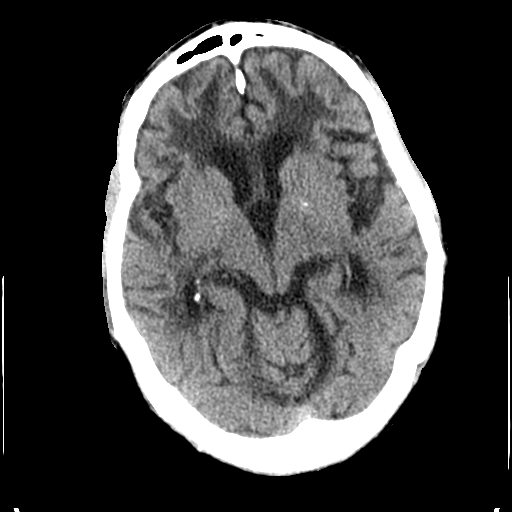
[im 14/30  bone]
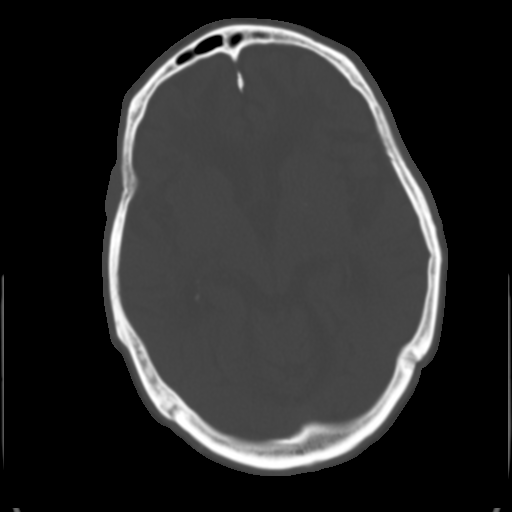
[im 16/30  brain]
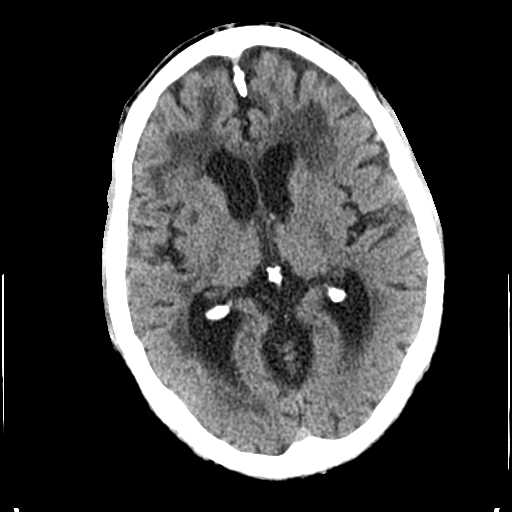
[im 19/30  brain]
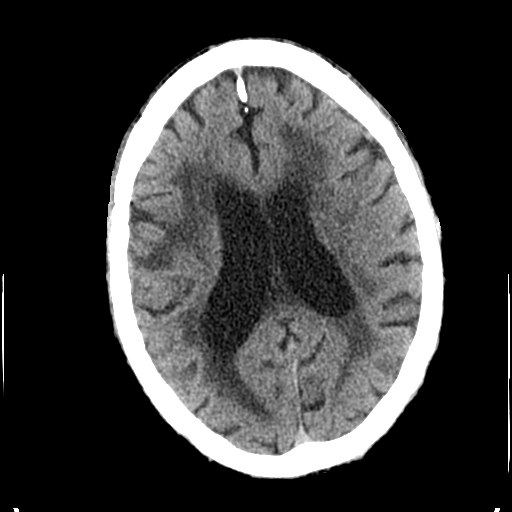
[im 22/30  brain]
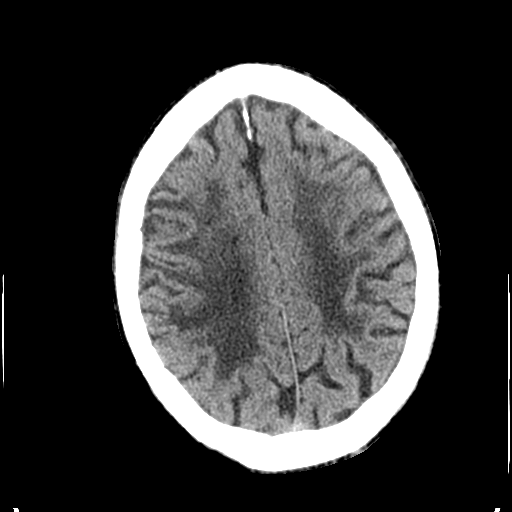
[im 24/30  brain]
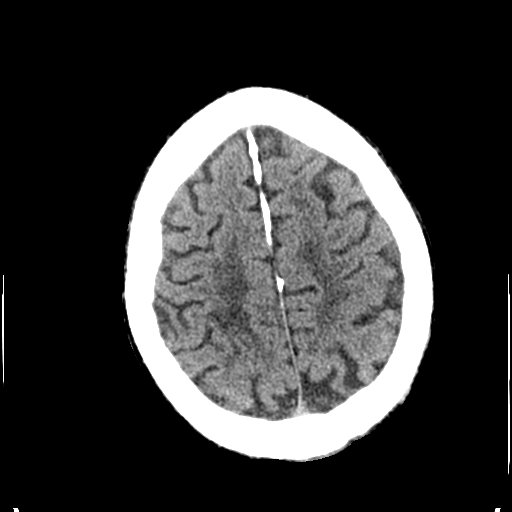
[im 24/30  bone]
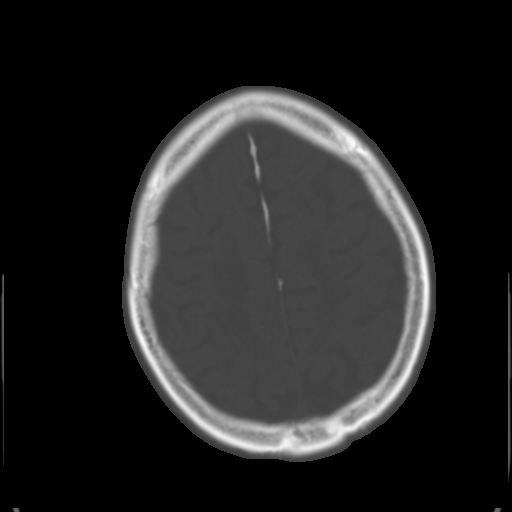
[im 27/30  brain]
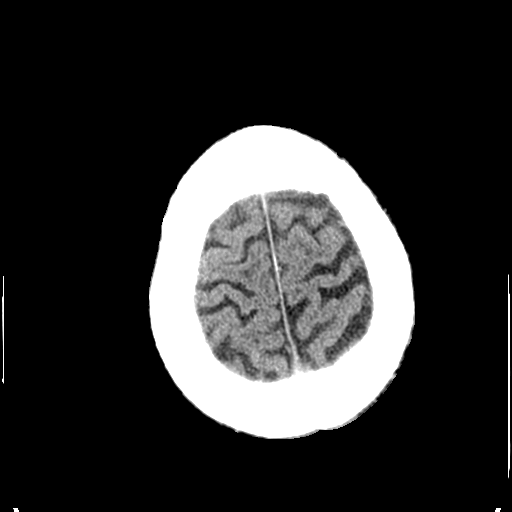

[Series 4: bone windows · axial · 0.45mm/px · z∈[+1269,+1349]mm · 6 of 30 slices shown]
[im 3/30  bone]
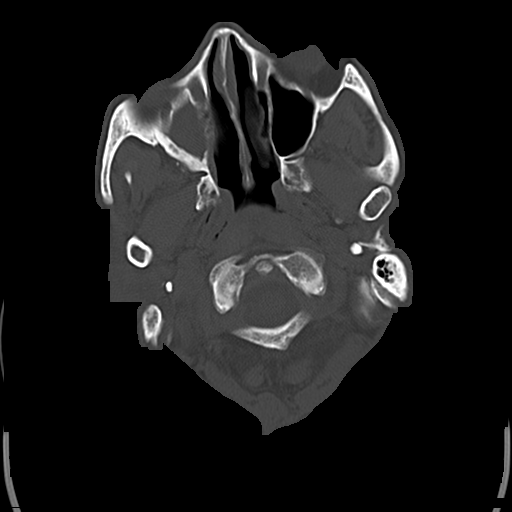
[im 6/30  bone]
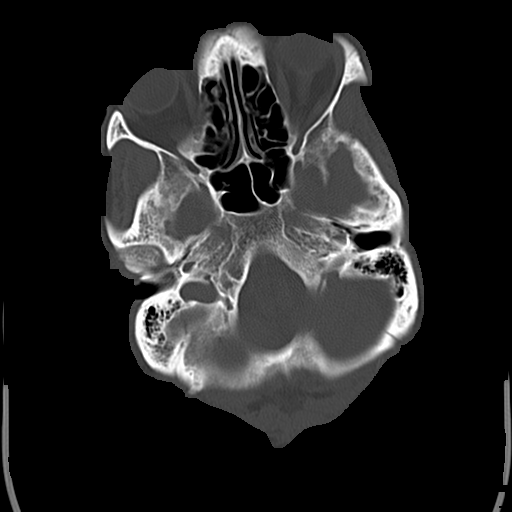
[im 11/30  bone]
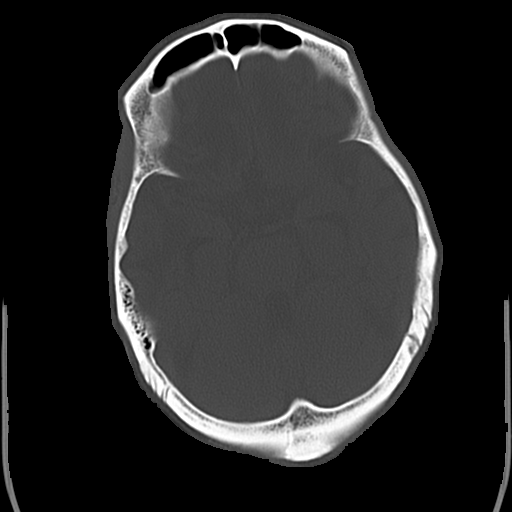
[im 14/30  bone]
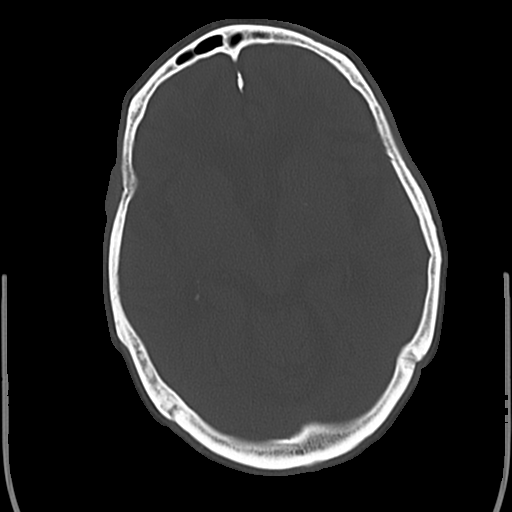
[im 16/30  bone]
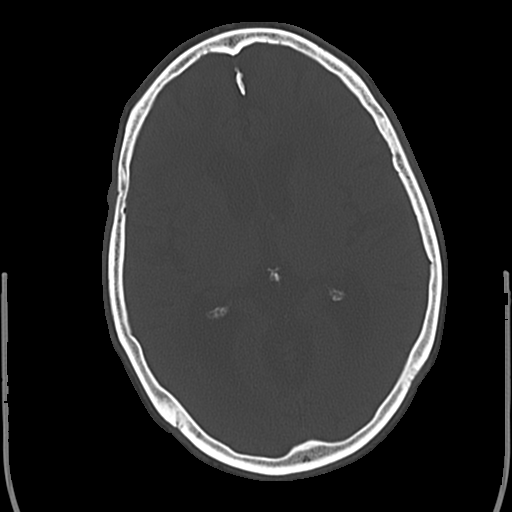
[im 19/30  bone]
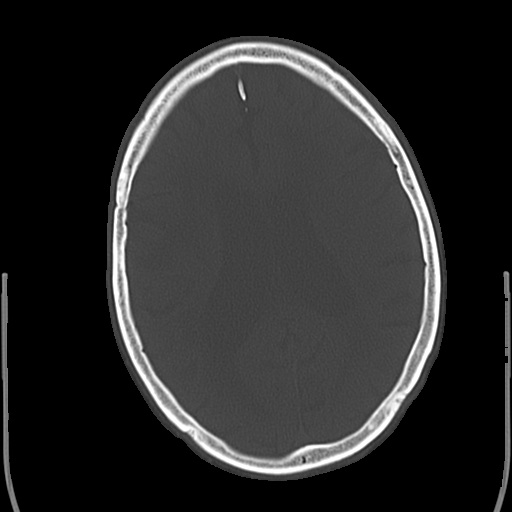

[16 of 30 positions shown; findings below may reference images not displayed]

FINDINGS: There is moderate diffuse atrophy. There is no intracranial mass,
hemorrhage, extra-axial fluid collection, or midline shift. There
are prior infarcts in each inferior frontal lobe. There is a prior
infarct in the periphery of the right temporal lobe. There is
evidence of a prior infarct in the right frontal -temporal junction.
There is widespread small vessel disease throughout the centra
semiovale bilaterally. There is evidence of a prior small infarct in
the anterior right lentiform nucleus. There is no acute appearing
infarct. Bony calvarium appears intact. The mastoid air cells are
clear. There is opacification of the right maxillary antrum. There
is a small polyp in the medial left maxillary antrum.
IMPRESSION: Atrophy with extensive supratentorial small vessel disease. Prior
infarcts as noted above. No intracranial mass, hemorrhage, or acute
appearing infarct. Paranasal sinus disease with essentially complete
opacification of the visualized right maxillary antrum.

## 2017-07-16 ENCOUNTER — Emergency Department (HOSPITAL_COMMUNITY): Payer: Medicare Other

## 2017-07-16 ENCOUNTER — Emergency Department (HOSPITAL_COMMUNITY)
Admission: EM | Admit: 2017-07-16 | Discharge: 2017-07-16 | Disposition: A | Discharge status: Skilled Nursing Facility | Payer: Medicare Other | Attending: Emergency Medicine | Admitting: Emergency Medicine

## 2017-07-16 ENCOUNTER — Encounter (HOSPITAL_COMMUNITY): Payer: Self-pay | Admitting: Nurse Practitioner

## 2017-07-16 DIAGNOSIS — E119 Type 2 diabetes mellitus without complications: Secondary | ICD-10-CM | POA: Diagnosis not present

## 2017-07-16 DIAGNOSIS — Z7901 Long term (current) use of anticoagulants: Secondary | ICD-10-CM | POA: Diagnosis not present

## 2017-07-16 DIAGNOSIS — J189 Pneumonia, unspecified organism: Secondary | ICD-10-CM | POA: Diagnosis not present

## 2017-07-16 DIAGNOSIS — F0391 Unspecified dementia with behavioral disturbance: Secondary | ICD-10-CM | POA: Diagnosis not present

## 2017-07-16 DIAGNOSIS — Z7982 Long term (current) use of aspirin: Secondary | ICD-10-CM | POA: Diagnosis not present

## 2017-07-16 DIAGNOSIS — I10 Essential (primary) hypertension: Secondary | ICD-10-CM | POA: Diagnosis not present

## 2017-07-16 DIAGNOSIS — J449 Chronic obstructive pulmonary disease, unspecified: Secondary | ICD-10-CM | POA: Diagnosis not present

## 2017-07-16 DIAGNOSIS — Z79899 Other long term (current) drug therapy: Secondary | ICD-10-CM | POA: Insufficient documentation

## 2017-07-16 DIAGNOSIS — R05 Cough: Secondary | ICD-10-CM | POA: Diagnosis present

## 2017-07-16 DIAGNOSIS — Z794 Long term (current) use of insulin: Secondary | ICD-10-CM | POA: Insufficient documentation

## 2017-07-16 DIAGNOSIS — R0602 Shortness of breath: Secondary | ICD-10-CM | POA: Insufficient documentation

## 2017-07-16 DIAGNOSIS — F17210 Nicotine dependence, cigarettes, uncomplicated: Secondary | ICD-10-CM | POA: Diagnosis not present

## 2017-07-16 DIAGNOSIS — R059 Cough, unspecified: Secondary | ICD-10-CM

## 2017-07-16 LAB — COMPREHENSIVE METABOLIC PANEL
ALT: 13 U/L — ABNORMAL LOW (ref 17–63)
ANION GAP: 10 (ref 5–15)
AST: 17 U/L (ref 15–41)
Albumin: 3.7 g/dL (ref 3.5–5.0)
Alkaline Phosphatase: 67 U/L (ref 38–126)
BUN: 24 mg/dL — AB (ref 6–20)
CHLORIDE: 99 mmol/L — AB (ref 101–111)
CO2: 29 mmol/L (ref 22–32)
Calcium: 8.9 mg/dL (ref 8.9–10.3)
Creatinine, Ser: 1.37 mg/dL — ABNORMAL HIGH (ref 0.61–1.24)
GFR calc Af Amer: 58 mL/min — ABNORMAL LOW (ref >60)
GFR, EST NON AFRICAN AMERICAN: 50 mL/min — AB (ref >60)
Glucose, Bld: 259 mg/dL — ABNORMAL HIGH (ref 65–99)
POTASSIUM: 3.7 mmol/L (ref 3.5–5.1)
Sodium: 138 mmol/L (ref 135–145)
TOTAL PROTEIN: 8.2 g/dL — AB (ref 6.5–8.1)
Total Bilirubin: 0.4 mg/dL (ref 0.3–1.2)

## 2017-07-16 LAB — CBC WITH DIFFERENTIAL/PLATELET
Basophils Absolute: 0 10*3/uL (ref 0.0–0.1)
Basophils Relative: 0 %
EOS PCT: 1 %
Eosinophils Absolute: 0.2 10*3/uL (ref 0.0–0.7)
HCT: 34.1 % — ABNORMAL LOW (ref 39.0–52.0)
Hemoglobin: 11.5 g/dL — ABNORMAL LOW (ref 13.0–17.0)
LYMPHS ABS: 1.6 10*3/uL (ref 0.7–4.0)
LYMPHS PCT: 12 %
MCH: 29.4 pg (ref 26.0–34.0)
MCHC: 33.7 g/dL (ref 30.0–36.0)
MCV: 87.2 fL (ref 78.0–100.0)
Monocytes Absolute: 0.4 10*3/uL (ref 0.1–1.0)
Monocytes Relative: 3 %
Neutro Abs: 11.5 10*3/uL — ABNORMAL HIGH (ref 1.7–7.7)
Neutrophils Relative %: 84 %
Platelets: 275 10*3/uL (ref 150–400)
RBC: 3.91 MIL/uL — AB (ref 4.22–5.81)
RDW: 13.5 % (ref 11.5–15.5)
WBC: 13.6 10*3/uL — ABNORMAL HIGH (ref 4.0–10.5)

## 2017-07-16 LAB — I-STAT TROPONIN, ED: TROPONIN I, POC: 0 ng/mL (ref 0.00–0.08)

## 2017-07-16 LAB — PROTIME-INR
INR: 0.98
PROTHROMBIN TIME: 12.9 s (ref 11.4–15.2)

## 2017-07-16 LAB — I-STAT CG4 LACTIC ACID, ED: LACTIC ACID, VENOUS: 1.82 mmol/L (ref 0.5–1.9)

## 2017-07-16 MED ORDER — PREDNISONE 20 MG PO TABS: 40.0000 mg | ORAL_TABLET | Freq: Every day | ORAL | 0 refills | Status: AC

## 2017-07-16 MED ORDER — DOXYCYCLINE HYCLATE 100 MG PO CAPS: 100.0000 mg | ORAL_CAPSULE | Freq: Two times a day (BID) | ORAL | 0 refills | Status: AC

## 2017-07-16 NOTE — ED Notes (Signed)
Patient ambulated without 02 at 91%

## 2017-07-16 NOTE — Discharge Instructions (Signed)
Your workup today showed evidence of both a COPD exacerbation and pneumonia. You're able to walk around without worsened symptoms and your oxygen level stayed in the 90s. After our shared decision-making conversation, we feel safe going home. Please call and follow up with your primary care physician in the next several days. Please take the antibiotics and steroids to help with your infection and lung inflammation. If any symptoms change or worsen, please return to the nearest emergency department.

## 2017-07-16 NOTE — ED Triage Notes (Signed)
Patient coming from [facility]. 2 day history of rhonchi. EMS gave albuterol and solumedrol and Atrovent. Patient states he feels better but still has rhonchi and productive cough.

## 2017-07-16 NOTE — ED Provider Notes (Signed)
[facility] DEPT Provider Note   CSN: [id number] Arrival date & time: 07/16/17  1404     History   Chief Complaint No chief complaint on file. Cough  HPI Edwin [name] is a 71 y.o. male.  The history is provided by the patient and medical records. No language interpreter was used.  Cough  This is a recurrent problem. The current episode started more than 2 days ago. The problem occurs constantly. The problem has not changed since onset.The cough is productive of sputum. There has been no fever. Associated symptoms include chills, shortness of breath and wheezing. Pertinent negatives include no chest pain, no ear pain, no headaches and no rhinorrhea. He has tried nothing for the symptoms. The treatment provided no relief. His past medical history is significant for COPD.    Past Medical History:  Diagnosis Date  . Anxiety   . Chest pain   . COPD (chronic obstructive pulmonary disease) ([place])   . Diabetes mellitus without complication [facility])     Patient Active Problem List   Diagnosis Date Noted  . Failure to thrive in adult   . 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 Encounter for preadmission testing   . Hyperglycemia 02/23/2015  . Renal insufficiency 02/23/2015  . DM 10/02/2007  . HYPERTENSION 10/02/2007  . MYOCARDIAL INFARCTION 10/02/2007  . ALLERGIC RHINITIS 10/02/2007  . EMPHYSEMA 10/02/2007  . COPD 10/02/2007  . PULMONARY NODULE 10/02/2007  . HEADACHE, CHRONIC 10/02/2007  . HYPERGLYCEMIA 10/02/2007    History reviewed. No pertinent surgical history.     Home Medications    Prior to Admission medications   Medication Sig Start Date End Date Taking? Authorizing Provider  amLODipine (NORVASC) 5 MG tablet Take 1 tablet (5 mg total) by mouth daily. 03/01/15   Delfin Gant, NP  aspirin 81 MG chewable tablet Chew 81 mg by mouth daily.    [provider]  famotidine (PEPCID) 20 MG tablet Take 1 tablet (20 mg total) by mouth 2 (two)  times daily. 03/01/15   Delfin Gant, NP  guaifenesin (ROBITUSSIN) 100 MG/5ML syrup Take 200 mg by mouth 3 (three) times daily.    [provider]  insulin aspart (NOVOLOG) 100 UNIT/ML injection Inject 0-9 Units into the skin 3 (three) times daily with meals. Patient taking differently: Inject 0-9 Units into the skin 3 (three) times daily with meals. Per Sliding Scale: 70-120:  0 units 121-150: 1 unit 150-200: 2 units 201-250: 3 units 251-300: 5 units 301-350: 7 units 351-400: 9 units 400 or greater: Call MD 03/02/15   Patrecia Pour, NP  metFORMIN (GLUCOPHAGE) 500 MG tablet Take 1 tablet (500 mg total) by mouth 2 (two) times daily with a meal. Patient taking differently: Take 750 mg by mouth 2 (two) times daily with a meal.  03/01/15   Charmaine Downs C, NP  mirtazapine (REMERON) 7.5 MG tablet Take 7.5 mg by mouth at bedtime.    [provider]  Nutritional Supplements (NUTRITIONAL SHAKE) LIQD Take 1 each by mouth 3 (three) times daily.    [provider]  permethrin (ELIMITE) 5 % cream Apply to affected area once 08/27/15   Drenda Freeze, MD  QUEtiapine (SEROQUEL) 50 MG tablet Take 1 tablet (50 mg total) by mouth at bedtime. 03/01/15   Delfin Gant, NP  sertraline (ZOLOFT) 25 MG tablet Take 1 tablet (25 mg total) by mouth daily. Patient taking differently: Take 75 mg by mouth daily.  03/01/15   Cleatrice Burke,  Wynona Meals, NP  simvastatin (ZOCOR) 20 MG tablet Take 1 tablet (20 mg total) by mouth every evening. 03/01/15   Delfin Gant, NP  triamterene-hydrochlorothiazide (MAXZIDE-25) 37.5-25 MG per tablet Take 0.5 tablets by mouth daily. 03/01/15   Delfin Gant, NP    Family History Family History  Problem Relation Age of Onset  . Family history unknown: Yes    Social History Social History  Substance Use Topics  . Smoking status: Current Every Day Smoker    Types: Cigarettes  . Smokeless tobacco: Not on file  . Alcohol use No      Allergies   Patient has no known allergies.   Review of Systems Review of Systems  Constitutional: Positive for chills. Negative for diaphoresis, fatigue and fever.  HENT: Positive for congestion. Negative for ear pain and rhinorrhea.   Respiratory: Positive for cough, shortness of breath and wheezing. Negative for chest tightness and stridor.   Cardiovascular: Negative for chest pain, palpitations and leg swelling.  Gastrointestinal: Negative for abdominal pain, nausea and vomiting.  Genitourinary: Negative for dysuria.  Musculoskeletal: Negative for back pain, neck pain and neck stiffness.  Skin: Negative for rash and wound.  Neurological: Negative for headaches.  Psychiatric/Behavioral: Negative for agitation and confusion.  All other systems reviewed and are negative.    Physical Exam Updated Vital Signs BP (!) 126/96 (BP Location: Right Arm)   Pulse 92   Resp 18   SpO2 100%   Physical Exam  Constitutional: He is oriented to person, place, and time. He appears well-developed and well-nourished. No distress.  HENT:  Head: Normocephalic.  Mouth/Throat: Oropharynx is clear and moist. No oropharyngeal exudate.  Eyes: Pupils are equal, round, and reactive to light.  Neck: Normal range of motion.  Cardiovascular: Intact distal pulses.  Tachycardia present.   No murmur heard. Pulmonary/Chest: Effort normal. No stridor. Tachypnea noted. He has wheezes. He has rhonchi. He exhibits no tenderness.  Abdominal: Soft. There is no tenderness.  Musculoskeletal: He exhibits no edema or tenderness.  Neurological: He is alert and oriented to person, place, and time. No sensory deficit. He exhibits normal muscle tone.  Skin: Capillary refill takes less than 2 seconds. He is not diaphoretic. No erythema. No pallor.  Psychiatric: He has a normal mood and affect.  Nursing note and vitals reviewed.    ED Treatments / Results  Labs (all labs ordered are listed, but only abnormal  results are displayed) Labs Reviewed  CBC WITH DIFFERENTIAL/PLATELET - Abnormal; Notable for the following:       Result Value   WBC 13.6 (*)    RBC 3.91 (*)    Hemoglobin 11.5 (*)    HCT 34.1 (*)    Neutro Abs 11.5 (*)    All other components within normal limits  COMPREHENSIVE METABOLIC PANEL - Abnormal; Notable for the following:    Chloride 99 (*)    Glucose, Bld 259 (*)    BUN 24 (*)    Creatinine, Ser 1.37 (*)    Total Protein 8.2 (*)    ALT 13 (*)    GFR calc non Af Amer 50 (*)    GFR calc Af Amer 58 (*)    All other components within normal limits  CULTURE, BLOOD (ROUTINE X 2)  CULTURE, BLOOD (ROUTINE X 2)  PROTIME-INR  I-STAT CG4 LACTIC ACID, ED  I-STAT TROPONIN, ED    EKG  EKG Interpretation  Date/Time:  Monday July 16 2017 15:12:48 EDT Ventricular Rate:  102 PR Interval:    QRS Duration: 121 QT Interval:  379 QTC Calculation: 494 R Axis:   90 Text Interpretation:  Sinus tachycardia RBBB and LPFB When compared to prior, faster rate. No STEMI Confirmed by Antony Blackbird [id number]) on 07/16/2017 3:41:51 PM Also confirmed by Antony Blackbird [phone]), editor Drema Pry [phone])  on 07/16/2017 3:44:22 PM       Radiology Dg Chest 2 View  Result Date: 07/16/2017 CLINICAL DATA:  Two days cough.  History of COPD and diabetes. EXAM: CHEST  2 VIEW COMPARISON:  Chest radiograph Feb 24, 2015 FINDINGS: Cardiomediastinal silhouette is normal, mildly calcified aortic knob. Mildly coarsened pulmonary interstitium, confluent in the lung bases without pleural effusion. Hyperinflation. No pneumothorax. Soft tissue planes and included osseous structures are nonsuspicious. IMPRESSION: COPD.  Superimposed bibasilar fibrosis versus pneumonia. Aortic Atherosclerosis (ICD10-I70.0). Electronically Signed   By: Elon Alas M.D.   On: 07/16/2017 16:16    Procedures Procedures (including critical care time)  Medications Ordered in ED Medications - No data to  display   Initial Impression / Assessment and Plan / ED Course  I have reviewed the triage vital signs and the nursing notes.  Pertinent labs & imaging results that were available during my care of the patient were reviewed by me and considered in my medical decision making (see chart for details).     Edwin [name] is a 71 y.o. male with a past medical history significant for hypertension, diabetes, COPD, dementia, and documentation showing prior MI who presents with chills, congestion, cough, and shortness of breath. Patientwas givenbreathing treatment and Solu-Medrol by EMS with improving bathing and wheezing. Patient says that for the last several days he has had a productive cough. He denies any hemoptysis. He denies any chest pain. He reports mild to moderate shortness of breath. He denies recent injuries. He denies any other nausea, vomiting, diaphoresis, conservation, diarrhea, dysuria. He says this feels like "a lung infection". He has no recent admissions.  On exam, rhonchi and wheezing throughout. Patient given another breathing treatment with improvement in wheezing. Patient's chest is nontender. Exam otherwise unremarkable. Patient initially was on 2 L nasal cannula but he was weaned from this after breathing treatments.  X-ray showed concern for pneumonia. Patient was ambulated on room air and maintain his oxygen saturation in the 90s. Patient did not want to be admitted for his pneumonia.  Patient will be given prescription for antibiotics for his pneumonia. He will also be given prescription for steroids given COPD exacerbation.  Patient continued to have no worsening symptoms during his period of observation. Patient felt to be stable for discharge home.  Patient understood return precautions for worsening pneumonia and his breathing. He also understood instructions to follow-up with his PCP. Patient had no depressions or concerns and was discharged in good condition with the  presenting symptoms.   Final Clinical Impressions(s) / ED Diagnoses   Final diagnoses:  Community acquired pneumonia, unspecified laterality  Cough  Shortness of breath    New Prescriptions New Prescriptions   DOXYCYCLINE (VIBRAMYCIN) 100 MG CAPSULE    Take 1 capsule (100 mg total) by mouth 2 (two) times daily.   PREDNISONE (DELTASONE) 20 MG TABLET    Take 2 tablets (40 mg total) by mouth daily.   Clinical Impression: 1. Community acquired pneumonia, unspecified laterality   2. Cough   3. Shortness of breath     Disposition: Discharge  Condition: Good  I have discussed the results, Dx and Tx  plan with the pt(& family if present). He/she/they expressed understanding and agree(s) with the plan. Discharge instructions discussed at great length. Strict return precautions discussed and pt &/or family have verbalized understanding of the instructions. No further questions at time of discharge.    New Prescriptions   DOXYCYCLINE (VIBRAMYCIN) 100 MG CAPSULE    Take 1 capsule (100 mg total) by mouth 2 (two) times daily.   PREDNISONE (DELTASONE) 20 MG TABLET    Take 2 tablets (40 mg total) by mouth daily.    Follow Up: Reymundo Poll, MD [place] [address] [phone]  Schedule an appointment as soon as possible for a visit    [facility] DE[facility] [place] [phone] [place] [phone]  If symptoms worsen     Tegeler, Gwenyth Allegra, MD 07/16/17 [phone]

## 2017-07-17 ENCOUNTER — Telehealth (HOSPITAL_BASED_OUTPATIENT_CLINIC_OR_DEPARTMENT_OTHER): Payer: Self-pay | Admitting: Emergency Medicine

## 2017-07-17 LAB — BLOOD CULTURE ID PANEL (REFLEXED)
ACINETOBACTER BAUMANNII: NOT DETECTED
CANDIDA ALBICANS: NOT DETECTED
CANDIDA TROPICALIS: NOT DETECTED
Candida glabrata: NOT DETECTED
Candida krusei: NOT DETECTED
Candida parapsilosis: NOT DETECTED
ENTEROBACTERIACEAE SPECIES: NOT DETECTED
Enterobacter cloacae complex: NOT DETECTED
Enterococcus species: NOT DETECTED
Escherichia coli: NOT DETECTED
HAEMOPHILUS INFLUENZAE: NOT DETECTED
Klebsiella oxytoca: NOT DETECTED
Klebsiella pneumoniae: NOT DETECTED
Listeria monocytogenes: NOT DETECTED
METHICILLIN RESISTANCE: DETECTED — AB
NEISSERIA MENINGITIDIS: NOT DETECTED
PSEUDOMONAS AERUGINOSA: NOT DETECTED
Proteus species: NOT DETECTED
SERRATIA MARCESCENS: NOT DETECTED
STAPHYLOCOCCUS AUREUS BCID: NOT DETECTED
STREPTOCOCCUS PNEUMONIAE: NOT DETECTED
STREPTOCOCCUS PYOGENES: NOT DETECTED
STREPTOCOCCUS SPECIES: NOT DETECTED
Staphylococcus species: DETECTED — AB
Streptococcus agalactiae: NOT DETECTED

## 2017-07-20 LAB — CULTURE, BLOOD (ROUTINE X 2): Special Requests: ADEQUATE

## 2017-07-21 ENCOUNTER — Telehealth: Payer: Self-pay

## 2017-07-21 LAB — CULTURE, BLOOD (ROUTINE X 2)
Culture: NO GROWTH
Special Requests: ADEQUATE

## 2017-07-21 NOTE — Telephone Encounter (Signed)
Post ED Visit - Positive Culture Follow-up  Culture report reviewed by antimicrobial stewardship pharmacist:  []  Elenor Quinones, Pharm.D. []  Heide Guile, Pharm.D., BCPS AQ-ID [x]  Parks Neptune, Pharm.D., BCPS []  Alycia Rossetti, Pharm.D., BCPS []  [place]man, Pharm.D., BCPS, AAHIVP []  Legrand Como, Pharm.D., BCPS, AAHIVP []  Salome Arnt, PharmD, BCPS []  Dimitri Ped, PharmD, BCPS []  Vincenza Hews, PharmD, BCPS  Positive [facility] culture Treated with Doxycycline, organism sensitive to the same and no further patient follow-up is required at this time.  Genia Del 07/21/2017, 11:50 AM

## 2018-04-17 ENCOUNTER — Other Ambulatory Visit: Payer: Self-pay

## 2018-04-17 ENCOUNTER — Encounter (HOSPITAL_COMMUNITY): Payer: Self-pay

## 2018-04-17 ENCOUNTER — Emergency Department (HOSPITAL_COMMUNITY)
Admission: EM | Admit: 2018-04-17 | Discharge: 2018-04-18 | Disposition: A | Discharge status: Skilled Nursing Facility | Payer: Medicare Other | Attending: Emergency Medicine | Admitting: Emergency Medicine

## 2018-04-17 DIAGNOSIS — N4889 Other specified disorders of penis: Secondary | ICD-10-CM | POA: Diagnosis present

## 2018-04-17 DIAGNOSIS — I10 Essential (primary) hypertension: Secondary | ICD-10-CM | POA: Diagnosis not present

## 2018-04-17 DIAGNOSIS — F0391 Unspecified dementia with behavioral disturbance: Secondary | ICD-10-CM | POA: Diagnosis not present

## 2018-04-17 DIAGNOSIS — F17210 Nicotine dependence, cigarettes, uncomplicated: Secondary | ICD-10-CM | POA: Insufficient documentation

## 2018-04-17 DIAGNOSIS — E119 Type 2 diabetes mellitus without complications: Secondary | ICD-10-CM | POA: Insufficient documentation

## 2018-04-17 DIAGNOSIS — I252 Old myocardial infarction: Secondary | ICD-10-CM | POA: Diagnosis not present

## 2018-04-17 DIAGNOSIS — Z7982 Long term (current) use of aspirin: Secondary | ICD-10-CM | POA: Insufficient documentation

## 2018-04-17 DIAGNOSIS — J449 Chronic obstructive pulmonary disease, unspecified: Secondary | ICD-10-CM | POA: Insufficient documentation

## 2018-04-17 DIAGNOSIS — R369 Urethral discharge, unspecified: Secondary | ICD-10-CM

## 2018-04-17 DIAGNOSIS — Z794 Long term (current) use of insulin: Secondary | ICD-10-CM | POA: Diagnosis not present

## 2018-04-17 DIAGNOSIS — Z79899 Other long term (current) drug therapy: Secondary | ICD-10-CM | POA: Diagnosis not present

## 2018-04-17 LAB — URINALYSIS, ROUTINE W REFLEX MICROSCOPIC
Bilirubin Urine: NEGATIVE
Glucose, UA: NEGATIVE mg/dL
Hgb urine dipstick: NEGATIVE
KETONES UR: NEGATIVE mg/dL
Nitrite: NEGATIVE
PROTEIN: NEGATIVE mg/dL
Specific Gravity, Urine: 1.014 (ref 1.005–1.030)
pH: 5 (ref 5.0–8.0)

## 2018-04-17 MED ORDER — CEFTRIAXONE SODIUM 250 MG IJ SOLR
250.0000 mg | Freq: Once | INTRAMUSCULAR | Status: AC
  Administered 2018-04-17: 250 mg via INTRAMUSCULAR
  Filled 2018-04-17: qty 250

## 2018-04-17 MED ORDER — DOXYCYCLINE HYCLATE 100 MG PO CAPS: 100.0000 mg | ORAL_CAPSULE | Freq: Two times a day (BID) | ORAL | 0 refills | Status: DC

## 2018-04-17 MED ORDER — IPRATROPIUM-ALBUTEROL 0.5-2.5 (3) MG/3ML IN SOLN
3.0000 mL | Freq: Once | RESPIRATORY_TRACT | Status: AC
  Administered 2018-04-17: 3 mL via RESPIRATORY_TRACT
  Filled 2018-04-17: qty 3

## 2018-04-17 MED ORDER — LIDOCAINE HCL 1 % IJ SOLN
INTRAMUSCULAR | Status: AC
  Administered 2018-04-17: 0.9 mL
  Filled 2018-04-17: qty 20

## 2018-04-17 NOTE — ED Notes (Signed)
Spoke with Patrice from [facility] to make aware of discharge.

## 2018-04-17 NOTE — Discharge Instructions (Addendum)
You have penile discharge.  You have been treated for STDs and urine culture is been sent along with STD testing.   have your doctor help follow with the results of the cultures.

## 2018-04-17 NOTE — ED Notes (Signed)
[facility] called for transport back to [facility].

## 2018-04-17 NOTE — ED Notes (Signed)
Spoke with Claiborne Billings from lab, urine culture added.

## 2018-04-17 NOTE — ED Triage Notes (Signed)
Staff at [facility] noted pt. To have "green penile discharge" today and sent him here for eval. He arrives alert and in no distress. He is confused at his baseline and is not ill in appearance.

## 2018-04-17 NOTE — ED Provider Notes (Signed)
[facility] DEPT Provider Note   CSN: [id number] Arrival date & time: 04/17/18  1619     History   Chief Complaint Chief Complaint  Patient presents with  . Penile Discharge    HPI Edwin [name] is a 72 y.o. male.  HPI Level 5 caveat due to dementia. Reportedly sent in for green penile discharge.  Patient is demented and cannot really provide history.  When asked about dysuria and sexual history he will only state that his toes hurt when he walks.  Does have a rather frequent cough. Past Medical History:  Diagnosis Date  . Anxiety   . Chest pain   . COPD (chronic obstructive pulmonary disease) ([place])   . Diabetes mellitus without complication ([place])   . PVD (peripheral vascular disease) ([facility])    Severe with occlusion of L PTA, ATA, and DPA, L extremity  . 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     Patient Active Problem List   Diagnosis Date Noted  . Failure to thrive in adult   . 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 Encounter for preadmission testing   . Hyperglycemia 02/23/2015  . Renal insufficiency 02/23/2015  . DM 10/02/2007  . HYPERTENSION 10/02/2007  . MYOCARDIAL INFARCTION 10/02/2007  . ALLERGIC RHINITIS 10/02/2007  . EMPHYSEMA 10/02/2007  . COPD 10/02/2007  . PULMONARY NODULE 10/02/2007  . HEADACHE, CHRONIC 10/02/2007  . HYPERGLYCEMIA 10/02/2007    No past surgical history on file.      Home Medications    Prior to Admission medications   Medication Sig Start Date End Date Taking? Authorizing Provider  acetaminophen (TYLENOL) 500 MG tablet Take 500 mg by mouth every 4 (four) hours as needed for moderate pain.   Yes [provider]  alum & mag hydroxide-simeth ([place]) 200-200-20 MG/5ML suspension Take 30 mLs by mouth every 6 (six) hours as needed for indigestion or heartburn.   Yes [provider]    amLODipine (NORVASC) 5 MG tablet Take 1 tablet (5 mg total) by mouth daily. 03/01/15  Yes Delfin Gant, NP  aspirin 81 MG chewable tablet Chew 81 mg by mouth daily.   Yes [provider]  cetirizine (ZYRTEC) 10 MG tablet Take 10 mg by mouth daily as needed for allergies.   Yes [provider]  clindamycin (CLEOCIN T) 1 % external solution Apply 1 application topically 2 (two) times daily as needed (pustules).   Yes [provider]  famotidine (PEPCID) 20 MG tablet Take 1 tablet (20 mg total) by mouth 2 (two) times daily. Patient taking differently: Take 20 mg by mouth at bedtime.  03/01/15  Yes Charmaine Downs C, NP  Fluticasone-Salmeterol (ADVAIR) 250-50 MCG/DOSE AEPB Inhale 1 puff into the lungs 2 (two) times daily.   Yes [provider]  gabapentin (NEURONTIN) 100 MG capsule Take 200 mg by mouth 2 (two) times daily.  04/10/18  Yes [provider]  ipratropium-albuterol (DUONEB) 0.5-2.5 (3) MG/3ML SOLN Take 3 mLs by nebulization every 4 (four) hours as needed (sob and wheezing).   Yes [provider]  LORazepam (ATIVAN) 0.5 MG tablet Take 0.5 mg by mouth as directed. Take 1 tablet (0.5 mg) BID and Take 1 tablet TID PRN FOR ANXIETY   Yes [provider]  magnesium hydroxide (MILK OF MAGNESIA) 400 MG/5ML suspension Take 30 mLs by mouth daily as needed for mild constipation.   Yes [provider]  metFORMIN (GLUCOPHAGE) 1000 MG tablet Take 1,000 mg by mouth 2 (two) times daily with a meal.   Yes [provider]  MINERAL OIL LIGHT EX Apply 1 application topically daily as needed (dry skin).    Yes [provider]  mirtazapine (REMERON) 7.5 MG tablet Take 7.5 mg by mouth at bedtime.   Yes [provider]  neomycin-bacitracin-polymyxin (NEOSPORIN) [phone] ointment Apply 1 application topically daily as needed (skin tear).   Yes [provider]  Nutritional Supplements (NUTRITIONAL SHAKE)  LIQD Take 1 each by mouth 3 (three) times daily.   Yes [provider]  QUEtiapine (SEROQUEL) 25 MG tablet Take 75 mg by mouth at bedtime.  03/24/18  Yes [provider]  sertraline (ZOLOFT) 50 MG tablet Take 75 mg by mouth daily.   Yes [provider]  simvastatin (ZOCOR) 20 MG tablet Take 1 tablet (20 mg total) by mouth every evening. 03/01/15  Yes Charmaine Downs C, NP  traMADol (ULTRAM) 50 MG tablet Take 50 mg by mouth at bedtime.  04/15/18  Yes [provider]  triamterene-hydrochlorothiazide (MAXZIDE-25) 37.5-25 MG per tablet Take 0.5 tablets by mouth daily. 03/01/15  Yes Delfin Gant, NP  doxycycline (VIBRAMYCIN) 100 MG capsule Take 1 capsule (100 mg total) by mouth 2 (two) times daily. 04/17/18   Davonna Belling, MD  guaifenesin (ROBITUSSIN) 100 MG/5ML syrup Take 200 mg by mouth 4 (four) times daily as needed for cough.     [provider]  insulin aspart (NOVOLOG) 100 UNIT/ML injection Inject 0-9 Units into the skin 3 (three) times daily with meals. Patient not taking: Reported on 07/16/2017 03/02/15   Patrecia Pour, NP  loperamide (IMODIUM) 2 MG capsule Take 2 mg by mouth as needed for diarrhea or loose stools.    [provider]  metFORMIN (GLUCOPHAGE) 500 MG tablet Take 1 tablet (500 mg total) by mouth 2 (two) times daily with a meal. Patient not taking: Reported on 07/16/2017 03/01/15   Delfin Gant, NP  permethrin (ELIMITE) 5 % cream Apply to affected area once Patient not taking: Reported on 07/16/2017 08/27/15   Drenda Freeze, MD  QUEtiapine (SEROQUEL) 50 MG tablet Take 1 tablet (50 mg total) by mouth at bedtime. Patient not taking: Reported on 04/17/2018 03/01/15   Delfin Gant, NP  sertraline (ZOLOFT) 25 MG tablet Take 1 tablet (25 mg total) by mouth daily. Patient not taking: Reported on 07/16/2017 03/01/15   Delfin Gant, NP    Family History Family History  Family history unknown: Yes    Social  History Social History   Tobacco Use  . Smoking status: Current Every Day Smoker    Types: Cigarettes  Substance Use Topics  . Alcohol use: No    Alcohol/week: 0.0 oz  . Drug use: No     Allergies   Patient has no known allergies.   Review of Systems Review of Systems  Unable to perform ROS: Dementia     Physical Exam Updated Vital Signs BP (!) 157/97 (BP Location: Left Arm)   Pulse 82   Temp 97.6 F (36.4 C) (Oral)   Resp 16   Ht 5\' 10"  (1.778 m)   Wt 49.9 kg (110 lb)   SpO2 100%   BMI 15.78 kg/m   Physical Exam  Constitutional: He appears well-developed.  HENT:  Head: Normocephalic.  Eyes: Pupils are equal, round, and reactive to light.  Neck: Neck supple.  Cardiovascular: Normal rate.  Pulmonary/Chest:  Harsh breath sounds with frequent wheezes.  Frequent cough.  Abdominal: There is no tenderness.  Genitourinary:  Genitourinary Comments: uncircumcised with greenish penile discharge.  No perineal tenderness.  No testicular tenderness.  Musculoskeletal: He exhibits no tenderness.  Neurological: He is alert.  Awake but demented.  Skin: Skin is warm.     ED Treatments / Results  Labs (all labs ordered are listed, but only abnormal results are displayed) Labs Reviewed  URINALYSIS, ROUTINE W REFLEX MICROSCOPIC - Abnormal; Notable for the following components:      Result Value   Leukocytes, UA TRACE (*)    Bacteria, UA RARE (*)    All other components within normal limits  URINE CULTURE  RPR  HIV ANTIBODY (ROUTINE TESTING)  GC/CHLAMYDIA PROBE AMP ([place]) NOT AT [facility]    EKG None  Radiology No results found.  Procedures Procedures (including critical care time)  Medications Ordered in ED Medications  cefTRIAXone (ROCEPHIN) injection 250 mg (has no administration in time range)  ipratropium-albuterol (DUONEB) 0.5-2.5 (3) MG/3ML nebulizer solution 3 mL (3 mLs Nebulization Given 04/17/18 1701)     Initial Impression / Assessment and  Plan / ED Course  I have reviewed the triage vital signs and the nursing notes.  Pertinent labs & imaging results that were available during my care of the patient were reviewed by me and considered in my medical decision making (see chart for details).     Patient with penile discharge.  Urine sent.  Unable to get patient sexual history.  Unknown if he is sexually active.  HIV syphilis gonorrhea and Chlamydia testing sent.  Urinalysis and urine culture also sent note perineal or testicular tenderness.  The green discharge does look somewhat gonorrhea.  Will empirically treat with Rocephin and doxycycline.  Cultures will need to be followed.  Final Clinical Impressions(s) / ED Diagnoses   Final diagnoses:  Penile discharge    ED Discharge Orders        Ordered    doxycycline (VIBRAMYCIN) 100 MG capsule  2 times daily     04/17/18 2128       Davonna Belling, MD 04/17/18 2131

## 2018-04-17 NOTE — ED Notes (Addendum)
In and Out cath performed to collect required urine. Pt tolerated well.

## 2018-04-17 NOTE — ED Notes (Signed)
Bed: BV13 Expected date:  Expected time:  Means of arrival:  Comments: EMS/V.74

## 2018-04-18 LAB — HIV ANTIBODY (ROUTINE TESTING W REFLEX): HIV Screen 4th Generation wRfx: NONREACTIVE

## 2018-04-18 LAB — RPR: RPR: NONREACTIVE

## 2018-04-18 LAB — GC/CHLAMYDIA PROBE AMP (~~LOC~~) NOT AT ARMC
Chlamydia: NEGATIVE
NEISSERIA GONORRHEA: NEGATIVE

## 2018-04-18 NOTE — ED Notes (Signed)
Bed: [facility] Expected date:  Expected time:  Means of arrival:  Comments: 

## 2018-04-18 NOTE — ED Notes (Signed)
Called [facility] to check on status for transportation. It was stated that he was not on the list for pick up, so a new request was submitted.

## 2018-04-19 LAB — URINE CULTURE: Culture: NO GROWTH

## 2018-04-30 ENCOUNTER — Encounter: Payer: Self-pay | Admitting: *Deleted

## 2018-04-30 ENCOUNTER — Other Ambulatory Visit: Payer: Self-pay | Admitting: *Deleted

## 2018-04-30 ENCOUNTER — Encounter: Payer: Self-pay | Admitting: Vascular Surgery

## 2018-04-30 ENCOUNTER — Ambulatory Visit (INDEPENDENT_AMBULATORY_CARE_PROVIDER_SITE_OTHER): Payer: Medicare Other | Admitting: Vascular Surgery

## 2018-04-30 ENCOUNTER — Other Ambulatory Visit: Payer: Self-pay

## 2018-04-30 VITALS — BP 137/103 | HR 81 | Temp 97.0°F | Resp 16 | Ht 70.0 in | Wt 110.0 lb

## 2018-04-30 DIAGNOSIS — I739 Peripheral vascular disease, unspecified: Secondary | ICD-10-CM

## 2018-04-30 NOTE — H&P (View-Only) (Signed)
[facility]ist of [place]  Patient name: Edwin [name] MRN: [id number] DOB: October 16, 1946 Sex: male  REASON FOR CONSULT: Evaluation left leg ischemia  HPI: Edwin [name] is a 72 y.o. male, who is here today for evaluation of ischemia of his left leg.  He is a very difficult management situation.  He is here today with 2 staff members from [place] facility.  They are providing history since he has severe dementia.  He is currently in a wheelchair.  He apparently had been walking but now does not walk due to complaints of left foot pain.  The patient does report that he is having pain in his foot when I asked him directly.  No history of tissue loss.  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  I have searched his documents in epic and can find no documentation of this with either noninvasive studies or CT scan or angiography.  Coagulation and had normal renal function in late 2018  Past Medical History:  Diagnosis Date  . Anxiety   . Chest pain   . COPD (chronic obstructive pulmonary disease) ([place])   . Diabetes mellitus without complication ([place])   . PVD (peripheral vascular disease) (HCC)    Severe with occlusion of L PTA, ATA, and DPA, L extremity  . 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     Family History  Family history unknown: Yes    SOCIAL HISTORY: Social History   Socioeconomic History  . Marital status: Single    Spouse name: Not on file  . Number of children: Not on file  . Years of education: Not on file  . Highest education level: Not on file  Occupational History  . Not on file  Social Needs  . Financial resource strain: Not on file  . Food insecurity:    Worry: Not on file    Inability: Not on file  . Transportation needs:    Medical: Not on file   Non-medical: Not on file  Tobacco Use  . Smoking status: Former Smoker    Types: Cigarettes  . Smokeless tobacco: Never Used  Substance and Sexual Activity  . Alcohol use: No    Alcohol/week: 0.0 oz  . Drug use: No  . Sexual activity: Not on file  Lifestyle  . Physical activity:    Days per week: Not on file    Minutes per session: Not on file  . Stress: Not on file  Relationships  . Social connections:    Talks on phone: Not on file    Gets together: Not on file    Attends religious service: Not on file    Active member of club or organization: Not on file    Attends meetings of clubs or organizations: Not on file    Relationship status: Not on file  . Intimate partner violence:    Fear of current or ex partner: Not on file    Emotionally abused: Not on file    Physically abused: Not on file    Forced sexual activity: Not on file  Other Topics Concern  . Not on file  Social History Narrative  . Not on file    No Known Allergies  Current Outpatient Medications  Medication Sig Dispense Refill  . acetaminophen (TYLENOL) 500 MG tablet Take 500 mg by mouth every  4 (four) hours as needed for moderate pain.    Marland Kitchen alum & mag hydroxide-simeth (MINTOX) [phone] MG/5ML suspension Take 30 mLs by mouth every 6 (six) hours as needed for indigestion or heartburn.    Marland Kitchen amLODipine (NORVASC) 5 MG tablet Take 1 tablet (5 mg total) by mouth daily. 30 tablet 0  . aspirin 81 MG chewable tablet Chew 81 mg by mouth daily.    . cetirizine (ZYRTEC) 10 MG tablet Take 10 mg by mouth daily as needed for allergies.    Marland Kitchen doxycycline (VIBRAMYCIN) 100 MG capsule Take 1 capsule (100 mg total) by mouth 2 (two) times daily. 14 capsule 0  . famotidine (PEPCID) 20 MG tablet Take 1 tablet (20 mg total) by mouth 2 (two) times daily. (Patient taking differently: Take 20 mg by mouth at bedtime. ) 30 tablet 0  . Fluticasone-Salmeterol (ADVAIR) 250-50 MCG/DOSE AEPB Inhale 1 puff into the lungs 2 (two) times  daily.    Marland Kitchen gabapentin (NEURONTIN) 100 MG capsule Take 200 mg by mouth 2 (two) times daily.     Marland Kitchen ipratropium-albuterol (DUONEB) 0.5-2.5 (3) MG/3ML SOLN Take 3 mLs by nebulization every 4 (four) hours as needed (sob and wheezing).    Marland Kitchen loperamide (IMODIUM) 2 MG capsule Take 2 mg by mouth as needed for diarrhea or loose stools.    Marland Kitchen LORazepam (ATIVAN) 0.5 MG tablet Take 0.5 mg by mouth as directed. Take 1 tablet (0.5 mg) BID and Take 1 tablet TID PRN FOR ANXIETY    . magnesium hydroxide (MILK OF MAGNESIA) 400 MG/5ML suspension Take 30 mLs by mouth daily as needed for mild constipation.    . metFORMIN (GLUCOPHAGE) 1000 MG tablet Take 1,000 mg by mouth 2 (two) times daily with a meal.    . metFORMIN (GLUCOPHAGE) 500 MG tablet Take 1 tablet (500 mg total) by mouth 2 (two) times daily with a meal. 60 tablet 0  . MINERAL OIL LIGHT EX Apply 1 application topically daily as needed (dry skin).     . mirtazapine (REMERON) 7.5 MG tablet Take 7.5 mg by mouth at bedtime.    Marland Kitchen neomycin-bacitracin-polymyxin (NEOSPORIN) [phone] ointment Apply 1 application topically daily as needed (skin tear).    . Nutritional Supplements (NUTRITIONAL SHAKE) LIQD Take 1 each by mouth 3 (three) times daily.    . permethrin (ELIMITE) 5 % cream Apply to affected area once 60 g 0  . QUEtiapine (SEROQUEL) 25 MG tablet Take 75 mg by mouth at bedtime.     . sertraline (ZOLOFT) 50 MG tablet Take 75 mg by mouth daily.    . simvastatin (ZOCOR) 20 MG tablet Take 1 tablet (20 mg total) by mouth every evening. 30 tablet 0  . traMADol (ULTRAM) 50 MG tablet Take 50 mg by mouth at bedtime.     . triamterene-hydrochlorothiazide (MAXZIDE-25) 37.5-25 MG per tablet Take 0.5 tablets by mouth daily. 30 tablet 0  . clindamycin (CLEOCIN T) 1 % external solution Apply 1 application topically 2 (two) times daily as needed (pustules).    Marland Kitchen guaifenesin (ROBITUSSIN) 100 MG/5ML syrup Take 200 mg by mouth 4 (four) times daily as needed for cough.     .  insulin aspart (NOVOLOG) 100 UNIT/ML injection Inject 0-9 Units into the skin 3 (three) times daily with meals. (Patient not taking: Reported on 07/16/2017) 10 mL 11   No current facility-administered medications for this visit.     REVIEW OF SYSTEMS:  [X]  denotes positive finding, [ ]  denotes negative finding Cardiac  Comments:  Chest pain or chest pressure:    Shortness of breath upon exertion:    Short of breath when lying flat:    Irregular heart rhythm:        Vascular    Pain in calf, thigh, or hip brought on by ambulation: x   Pain in feet at night that wakes you up from your sleep:  x   Blood clot in your veins:    Leg swelling:         Pulmonary    Oxygen at home:    Productive cough:     Wheezing:  x       Neurologic    Sudden weakness in arms or legs:     Sudden numbness in arms or legs:     Sudden onset of difficulty speaking or slurred speech:    Temporary loss of vision in one eye:     Problems with dizziness:         Gastrointestinal    Blood in stool:     Vomited blood:         Genitourinary    Burning when urinating:     Blood in urine:        Psychiatric    Major depression:         Hematologic    Bleeding problems:    Problems with blood clotting too easily:        Skin    Rashes or ulcers:        Constitutional    Fever or chills:      PHYSICAL EXAM: Vitals:   04/30/18 1152  BP: (!) 137/103  Pulse: 81  Resp: 16  Temp: (!) 97 F (36.1 C)  TempSrc: Oral  SpO2: 97%  Weight: 110 lb (49.9 kg)  Height: 5\' 10"  (1.778 m)    GENERAL: The patient is a well-nourished male, in no acute distress. The vital signs are documented above. CARDIOVASCULAR: Palpable radial pulses.  He has 2+ femoral pulses bilaterally.  He has an absent left popliteal and distal pulses.  Has palpable right popliteal pulse and absent distal pulses PULMONARY: There is good air exchange  ABDOMEN: Soft and non-tender  MUSCULOSKELETAL: There are no major deformities or  cyanosis. NEUROLOGIC: No focal weakness or paresthesias are detected. SKIN: There are no ulcers or rashes noted.  He does have dependent rubor in his left foot.  Normal on the right.  Does have some inflammation around his nailbeds but no specific tissue loss   pSYCHIATRIC: The patient has a normal affect.  DATA:  None  MEDICAL ISSUES: I discussed the issue with the patient who has a very poor understanding related to his severe dementia.  Splane this to the staff from his nursing facility are quite attentive.  Physical findings of severe ischemia of his left foot.  I certainly would not entertain major surgical revascularization.  Would recommend arteriography to determine if straightforward endovascular options were available for improvement of his rest pain.  Will schedule this at his earliest convenience as an outpatient   Rosetta Posner, MD [facility] Vascular and Vein Specialists of [facility] Tel [phone] Pager [phone]

## 2018-04-30 NOTE — Progress Notes (Signed)
Caregivers from [facility] present with patient for scheduling of this procedure. Verified they will contact patient's POA  about pending procedure and consenting for patient. Facility will arrange transportation. Reviewed all other pre-procedure instructions with staff present with patient today.

## 2018-04-30 NOTE — Progress Notes (Signed)
Vascular and Vein Specialist of [place]  Patient name: Edwin [name] MRN: [id number] DOB: 02-07-1946 Sex: male  REASON FOR CONSULT: Evaluation left leg ischemia  HPI: Edwin [name] is a 72 y.o. male, who is here today for evaluation of ischemia of his left leg.  He is a very difficult management situation.  He is here today with 2 staff members from [place] facility.  They are providing history since he has severe dementia.  He is currently in a wheelchair.  He apparently had been walking but now does not walk due to complaints of left foot pain.  The patient does report that he is having pain in his foot when I asked him directly.  No history of tissue loss.  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  I have searched his documents in epic and can find no documentation of this with either noninvasive studies or CT scan or angiography.  Coagulation and had normal renal function in late 2018  Past Medical History:  Diagnosis Date  . Anxiety   . Chest pain   . COPD (chronic obstructive pulmonary disease) ([place])   . Diabetes mellitus without complication ([place])   . PVD (peripheral vascular disease) (HCC)    Severe with occlusion of L PTA, ATA, and DPA, L extremity  . 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     Family History  Family history unknown: Yes    SOCIAL HISTORY: Social History   Socioeconomic History  . Marital status: Single    Spouse name: Not on file  . Number of children: Not on file  . Years of education: Not on file  . Highest education level: Not on file  Occupational History  . Not on file  Social Needs  . Financial resource strain: Not on file  . Food insecurity:    Worry: Not on file    Inability: Not on file  . Transportation needs:    Medical: Not on file   Non-medical: Not on file  Tobacco Use  . Smoking status: Former Smoker    Types: Cigarettes  . Smokeless tobacco: Never Used  Substance and Sexual Activity  . Alcohol use: No    Alcohol/week: 0.0 oz  . Drug use: No  . Sexual activity: Not on file  Lifestyle  . Physical activity:    Days per week: Not on file    Minutes per session: Not on file  . Stress: Not on file  Relationships  . Social connections:    Talks on phone: Not on file    Gets together: Not on file    Attends religious service: Not on file    Active member of club or organization: Not on file    Attends meetings of clubs or organizations: Not on file    Relationship status: Not on file  . Intimate partner violence:    Fear of current or ex partner: Not on file    Emotionally abused: Not on file    Physically abused: Not on file    Forced sexual activity: Not on file  Other Topics Concern  . Not on file  Social History Narrative  . Not on file    No Known Allergies  Current Outpatient Medications  Medication Sig Dispense Refill  . acetaminophen (TYLENOL) 500 MG tablet Take 500 mg by mouth every  4 (four) hours as needed for moderate pain.    Marland Kitchen alum & mag hydroxide-simeth (MINTOX) [phone] MG/5ML suspension Take 30 mLs by mouth every 6 (six) hours as needed for indigestion or heartburn.    Marland Kitchen amLODipine (NORVASC) 5 MG tablet Take 1 tablet (5 mg total) by mouth daily. 30 tablet 0  . aspirin 81 MG chewable tablet Chew 81 mg by mouth daily.    . cetirizine (ZYRTEC) 10 MG tablet Take 10 mg by mouth daily as needed for allergies.    Marland Kitchen doxycycline (VIBRAMYCIN) 100 MG capsule Take 1 capsule (100 mg total) by mouth 2 (two) times daily. 14 capsule 0  . famotidine (PEPCID) 20 MG tablet Take 1 tablet (20 mg total) by mouth 2 (two) times daily. (Patient taking differently: Take 20 mg by mouth at bedtime. ) 30 tablet 0  . Fluticasone-Salmeterol (ADVAIR) 250-50 MCG/DOSE AEPB Inhale 1 puff into the lungs 2 (two) times  daily.    Marland Kitchen gabapentin (NEURONTIN) 100 MG capsule Take 200 mg by mouth 2 (two) times daily.     Marland Kitchen ipratropium-albuterol (DUONEB) 0.5-2.5 (3) MG/3ML SOLN Take 3 mLs by nebulization every 4 (four) hours as needed (sob and wheezing).    Marland Kitchen loperamide (IMODIUM) 2 MG capsule Take 2 mg by mouth as needed for diarrhea or loose stools.    Marland Kitchen LORazepam (ATIVAN) 0.5 MG tablet Take 0.5 mg by mouth as directed. Take 1 tablet (0.5 mg) BID and Take 1 tablet TID PRN FOR ANXIETY    . magnesium hydroxide (MILK OF MAGNESIA) 400 MG/5ML suspension Take 30 mLs by mouth daily as needed for mild constipation.    . metFORMIN (GLUCOPHAGE) 1000 MG tablet Take 1,000 mg by mouth 2 (two) times daily with a meal.    . metFORMIN (GLUCOPHAGE) 500 MG tablet Take 1 tablet (500 mg total) by mouth 2 (two) times daily with a meal. 60 tablet 0  . MINERAL OIL LIGHT EX Apply 1 application topically daily as needed (dry skin).     . mirtazapine (REMERON) 7.5 MG tablet Take 7.5 mg by mouth at bedtime.    Marland Kitchen neomycin-bacitracin-polymyxin (NEOSPORIN) [phone] ointment Apply 1 application topically daily as needed (skin tear).    . Nutritional Supplements (NUTRITIONAL SHAKE) LIQD Take 1 each by mouth 3 (three) times daily.    . permethrin (ELIMITE) 5 % cream Apply to affected area once 60 g 0  . QUEtiapine (SEROQUEL) 25 MG tablet Take 75 mg by mouth at bedtime.     . sertraline (ZOLOFT) 50 MG tablet Take 75 mg by mouth daily.    . simvastatin (ZOCOR) 20 MG tablet Take 1 tablet (20 mg total) by mouth every evening. 30 tablet 0  . traMADol (ULTRAM) 50 MG tablet Take 50 mg by mouth at bedtime.     . triamterene-hydrochlorothiazide (MAXZIDE-25) 37.5-25 MG per tablet Take 0.5 tablets by mouth daily. 30 tablet 0  . clindamycin (CLEOCIN T) 1 % external solution Apply 1 application topically 2 (two) times daily as needed (pustules).    Marland Kitchen guaifenesin (ROBITUSSIN) 100 MG/5ML syrup Take 200 mg by mouth 4 (four) times daily as needed for cough.     .  insulin aspart (NOVOLOG) 100 UNIT/ML injection Inject 0-9 Units into the skin 3 (three) times daily with meals. (Patient not taking: Reported on 07/16/2017) 10 mL 11   No current facility-administered medications for this visit.     REVIEW OF SYSTEMS:  [X]  denotes positive finding, [ ]  denotes negative finding Cardiac  Comments:  Chest pain or chest pressure:    Shortness of breath upon exertion:    Short of breath when lying flat:    Irregular heart rhythm:        Vascular    Pain in calf, thigh, or hip brought on by ambulation: x   Pain in feet at night that wakes you up from your sleep:  x   Blood clot in your veins:    Leg swelling:         Pulmonary    Oxygen at home:    Productive cough:     Wheezing:  x       Neurologic    Sudden weakness in arms or legs:     Sudden numbness in arms or legs:     Sudden onset of difficulty speaking or slurred speech:    Temporary loss of vision in one eye:     Problems with dizziness:         Gastrointestinal    Blood in stool:     Vomited blood:         Genitourinary    Burning when urinating:     Blood in urine:        Psychiatric    Major depression:         Hematologic    Bleeding problems:    Problems with blood clotting too easily:        Skin    Rashes or ulcers:        Constitutional    Fever or chills:      PHYSICAL EXAM: Vitals:   04/30/18 1152  BP: (!) 137/103  Pulse: 81  Resp: 16  Temp: (!) 97 F (36.1 C)  TempSrc: Oral  SpO2: 97%  Weight: 110 lb (49.9 kg)  Height: 5\' 10"  (1.778 m)    GENERAL: The patient is a well-nourished male, in no acute distress. The vital signs are documented above. CARDIOVASCULAR: Palpable radial pulses.  He has 2+ femoral pulses bilaterally.  He has an absent left popliteal and distal pulses.  Has palpable right popliteal pulse and absent distal pulses PULMONARY: There is good air exchange  ABDOMEN: Soft and non-tender  MUSCULOSKELETAL: There are no major deformities or  cyanosis. NEUROLOGIC: No focal weakness or paresthesias are detected. SKIN: There are no ulcers or rashes noted.  He does have dependent rubor in his left foot.  Normal on the right.  Does have some inflammation around his nailbeds but no specific tissue loss   pSYCHIATRIC: The patient has a normal affect.  DATA:  None  MEDICAL ISSUES: I discussed the issue with the patient who has a very poor understanding related to his severe dementia.  Splane this to the staff from his nursing facility are quite attentive.  Physical findings of severe ischemia of his left foot.  I certainly would not entertain major surgical revascularization.  Would recommend arteriography to determine if straightforward endovascular options were available for improvement of his rest pain.  Will schedule this at his earliest convenience as an outpatient   Rosetta Posner, MD [facility] Vascular and Vein Specialists of [facility] Tel [phone] Pager [phone]

## 2018-05-07 ENCOUNTER — Encounter: Payer: Self-pay | Admitting: *Deleted

## 2018-05-07 ENCOUNTER — Ambulatory Visit (HOSPITAL_COMMUNITY)
Admission: RE | Admit: 2018-05-07 | Discharge: 2018-05-07 | Disposition: A | Discharge status: Home / Self Care | Payer: Medicare Other | Source: Ambulatory Visit | Attending: Surgery | Admitting: Surgery

## 2018-05-07 ENCOUNTER — Encounter (HOSPITAL_COMMUNITY)
Admission: RE | Disposition: A | Discharge status: Home / Self Care | Payer: Self-pay | Source: Ambulatory Visit | Attending: Surgery

## 2018-05-07 ENCOUNTER — Telehealth: Payer: Self-pay | Admitting: *Deleted

## 2018-05-07 ENCOUNTER — Other Ambulatory Visit: Payer: Self-pay | Admitting: *Deleted

## 2018-05-07 DIAGNOSIS — I4510 Unspecified right bundle-branch block: Secondary | ICD-10-CM | POA: Insufficient documentation

## 2018-05-07 DIAGNOSIS — Z7951 Long term (current) use of inhaled steroids: Secondary | ICD-10-CM | POA: Insufficient documentation

## 2018-05-07 DIAGNOSIS — E1151 Type 2 diabetes mellitus with diabetic peripheral angiopathy without gangrene: Secondary | ICD-10-CM | POA: Diagnosis not present

## 2018-05-07 DIAGNOSIS — Z538 Procedure and treatment not carried out for other reasons: Secondary | ICD-10-CM | POA: Insufficient documentation

## 2018-05-07 DIAGNOSIS — Z87891 Personal history of nicotine dependence: Secondary | ICD-10-CM | POA: Insufficient documentation

## 2018-05-07 DIAGNOSIS — R001 Bradycardia, unspecified: Secondary | ICD-10-CM | POA: Diagnosis not present

## 2018-05-07 DIAGNOSIS — I998 Other disorder of circulatory system: Secondary | ICD-10-CM | POA: Insufficient documentation

## 2018-05-07 DIAGNOSIS — F0390 Unspecified dementia without behavioral disturbance: Secondary | ICD-10-CM | POA: Diagnosis not present

## 2018-05-07 DIAGNOSIS — M79672 Pain in left foot: Secondary | ICD-10-CM | POA: Diagnosis not present

## 2018-05-07 DIAGNOSIS — Z79899 Other long term (current) drug therapy: Secondary | ICD-10-CM | POA: Insufficient documentation

## 2018-05-07 DIAGNOSIS — J449 Chronic obstructive pulmonary disease, unspecified: Secondary | ICD-10-CM | POA: Insufficient documentation

## 2018-05-07 DIAGNOSIS — F419 Anxiety disorder, unspecified: Secondary | ICD-10-CM | POA: Diagnosis not present

## 2018-05-07 DIAGNOSIS — Z7982 Long term (current) use of aspirin: Secondary | ICD-10-CM | POA: Insufficient documentation

## 2018-05-07 DIAGNOSIS — R9431 Abnormal electrocardiogram [ECG] [EKG]: Secondary | ICD-10-CM | POA: Diagnosis not present

## 2018-05-07 LAB — POCT I-STAT, CHEM 8
BUN: 32 mg/dL — ABNORMAL HIGH (ref 8–23)
CREATININE: 1.9 mg/dL — AB (ref 0.61–1.24)
Calcium, Ion: 0.98 mmol/L — ABNORMAL LOW (ref 1.15–1.40)
Chloride: 103 mmol/L (ref 98–111)
Glucose, Bld: 116 mg/dL — ABNORMAL HIGH (ref 70–99)
HEMATOCRIT: 35 % — AB (ref 39.0–52.0)
Hemoglobin: 11.9 g/dL — ABNORMAL LOW (ref 13.0–17.0)
POTASSIUM: 4 mmol/L (ref 3.5–5.1)
SODIUM: 141 mmol/L (ref 135–145)
TCO2: 28 mmol/L (ref 22–32)

## 2018-05-07 SURGERY — INVASIVE LAB ABORTED CASE

## 2018-05-07 MED ORDER — SODIUM CHLORIDE 0.9 % IV SOLN
INTRAVENOUS | Status: DC
  Administered 2018-05-07: 11:00:00 via INTRAVENOUS

## 2018-05-07 NOTE — Telephone Encounter (Signed)
Confirmed with DEE DEE administrator at [facility] date of 05/13/18 arrive at hospital at 8:00 am for procedure. (rescheduled per facility request). All pre-procedure instructions reviewed and faxed to [facility]. Confirmed they will arrange transportation and notify POA to give consent for this procedure.

## 2018-05-07 NOTE — Interval H&P Note (Signed)
History and Physical Interval Note:  05/07/2018 12:02 PM  Edwin [name]  has presented today for surgery, with the diagnosis of pvd  The various methods of treatment have been discussed with the patient and family. After consideration of risks, benefits and other options for treatment, the patient has consented to  Procedure(s): ABDOMINAL AORTOGRAM W/LOWER EXTREMITY (N/A) as a surgical intervention .  The patient's history has been reviewed, patient examined, no change in status, stable for surgery.  I have reviewed the patient's chart and labs.  Questions were answered to the patient's satisfaction.     Annamarie Major

## 2018-05-08 ENCOUNTER — Telehealth: Payer: Self-pay | Admitting: *Deleted

## 2018-05-08 NOTE — Telephone Encounter (Signed)
Confirmed with KAY @ [facility] that they received fax of instructions.Veified they will speak to family regarding Consent for procedure/transportation.

## 2018-05-13 ENCOUNTER — Ambulatory Visit (HOSPITAL_COMMUNITY)
Admission: RE | Admit: 2018-05-13 | Discharge: 2018-05-13 | Disposition: A | Discharge status: Home / Self Care | Payer: Medicare Other | Source: Ambulatory Visit | Attending: Vascular Surgery | Admitting: Vascular Surgery

## 2018-05-13 ENCOUNTER — Encounter (HOSPITAL_COMMUNITY)
Admission: RE | Disposition: A | Discharge status: Home / Self Care | Payer: Self-pay | Source: Ambulatory Visit | Attending: Vascular Surgery

## 2018-05-13 ENCOUNTER — Telehealth: Payer: Self-pay | Admitting: Vascular Surgery

## 2018-05-13 DIAGNOSIS — E119 Type 2 diabetes mellitus without complications: Secondary | ICD-10-CM | POA: Diagnosis not present

## 2018-05-13 DIAGNOSIS — Z87891 Personal history of nicotine dependence: Secondary | ICD-10-CM | POA: Diagnosis not present

## 2018-05-13 DIAGNOSIS — Z794 Long term (current) use of insulin: Secondary | ICD-10-CM | POA: Insufficient documentation

## 2018-05-13 DIAGNOSIS — J449 Chronic obstructive pulmonary disease, unspecified: Secondary | ICD-10-CM | POA: Insufficient documentation

## 2018-05-13 DIAGNOSIS — Z79899 Other long term (current) drug therapy: Secondary | ICD-10-CM | POA: Insufficient documentation

## 2018-05-13 DIAGNOSIS — F419 Anxiety disorder, unspecified: Secondary | ICD-10-CM | POA: Insufficient documentation

## 2018-05-13 DIAGNOSIS — I739 Peripheral vascular disease, unspecified: Secondary | ICD-10-CM | POA: Insufficient documentation

## 2018-05-13 DIAGNOSIS — I998 Other disorder of circulatory system: Secondary | ICD-10-CM | POA: Insufficient documentation

## 2018-05-13 DIAGNOSIS — Z7982 Long term (current) use of aspirin: Secondary | ICD-10-CM | POA: Insufficient documentation

## 2018-05-13 DIAGNOSIS — Z993 Dependence on wheelchair: Secondary | ICD-10-CM | POA: Insufficient documentation

## 2018-05-13 DIAGNOSIS — F0390 Unspecified dementia without behavioral disturbance: Secondary | ICD-10-CM | POA: Insufficient documentation

## 2018-05-13 HISTORY — PX: ABDOMINAL AORTOGRAM W/LOWER EXTREMITY: CATH118223

## 2018-05-13 LAB — POCT I-STAT, CHEM 8
BUN: 36 mg/dL — ABNORMAL HIGH (ref 8–23)
CREATININE: 1.4 mg/dL — AB (ref 0.61–1.24)
Calcium, Ion: 1.11 mmol/L — ABNORMAL LOW (ref 1.15–1.40)
Chloride: 99 mmol/L (ref 98–111)
GLUCOSE: 111 mg/dL — AB (ref 70–99)
HCT: 39 % (ref 39.0–52.0)
HEMOGLOBIN: 13.3 g/dL (ref 13.0–17.0)
Potassium: 7.3 mmol/L (ref 3.5–5.1)
SODIUM: 138 mmol/L (ref 135–145)
TCO2: 34 mmol/L — AB (ref 22–32)

## 2018-05-13 LAB — GLUCOSE, CAPILLARY: Glucose-Capillary: 114 mg/dL — ABNORMAL HIGH (ref 70–99)

## 2018-05-13 SURGERY — ABDOMINAL AORTOGRAM W/LOWER EXTREMITY
Anesthesia: LOCAL

## 2018-05-13 MED ORDER — HEPARIN (PORCINE) IN NACL 1000-0.9 UT/500ML-% IV SOLN
INTRAVENOUS | Status: AC
  Filled 2018-05-13: qty 1000

## 2018-05-13 MED ORDER — SODIUM CHLORIDE 0.9% FLUSH: 3.0000 mL | INTRAVENOUS | Status: DC | PRN

## 2018-05-13 MED ORDER — LIDOCAINE HCL (PF) 1 % IJ SOLN
INTRAMUSCULAR | Status: DC | PRN
  Administered 2018-05-13: 30 mL via INTRADERMAL

## 2018-05-13 MED ORDER — ACETAMINOPHEN 325 MG PO TABS: 650.0000 mg | ORAL_TABLET | ORAL | Status: DC | PRN

## 2018-05-13 MED ORDER — HEPARIN (PORCINE) IN NACL 1000-0.9 UT/500ML-% IV SOLN
INTRAVENOUS | Status: DC | PRN
  Administered 2018-05-13 (×2): 500 mL

## 2018-05-13 MED ORDER — SODIUM CHLORIDE 0.9 % IV SOLN
INTRAVENOUS | Status: DC
  Administered 2018-05-13: 11:00:00 via INTRAVENOUS

## 2018-05-13 MED ORDER — LIDOCAINE HCL (PF) 1 % IJ SOLN
INTRAMUSCULAR | Status: AC
  Filled 2018-05-13: qty 30

## 2018-05-13 MED ORDER — ONDANSETRON HCL 4 MG/2ML IJ SOLN
INTRAMUSCULAR | Status: DC | PRN
  Administered 2018-05-13: 4 mg via INTRAVENOUS

## 2018-05-13 MED ORDER — SODIUM CHLORIDE 0.9 % WEIGHT BASED INFUSION: 1.0000 mL/kg/h | INTRAVENOUS | Status: DC

## 2018-05-13 MED ORDER — HYDRALAZINE HCL 20 MG/ML IJ SOLN: 5.0000 mg | INTRAMUSCULAR | Status: DC | PRN

## 2018-05-13 MED ORDER — SODIUM CHLORIDE 0.9% FLUSH: 3.0000 mL | Freq: Two times a day (BID) | INTRAVENOUS | Status: DC

## 2018-05-13 MED ORDER — LABETALOL HCL 5 MG/ML IV SOLN: 10.0000 mg | INTRAVENOUS | Status: DC | PRN

## 2018-05-13 MED ORDER — IODIXANOL 320 MG/ML IV SOLN
INTRAVENOUS | Status: DC | PRN
  Administered 2018-05-13: 40 mL via INTRA_ARTERIAL

## 2018-05-13 MED ORDER — ONDANSETRON HCL 4 MG/2ML IJ SOLN
INTRAMUSCULAR | Status: AC
  Filled 2018-05-13: qty 2

## 2018-05-13 MED ORDER — SODIUM CHLORIDE 0.9 % IV SOLN: 250.0000 mL | INTRAVENOUS | Status: DC | PRN

## 2018-05-13 MED ORDER — ONDANSETRON HCL 4 MG/2ML IJ SOLN
4.0000 mg | Freq: Four times a day (QID) | INTRAMUSCULAR | Status: DC | PRN
Start: 2018-05-13 — End: 2018-05-13

## 2018-05-13 SURGICAL SUPPLY — 13 items
CATH OMNI FLUSH 5F 65CM (CATHETERS) ×2 IMPLANT
DEVICE CLOSURE MYNXGRIP 5F (Vascular Products) ×2 IMPLANT
FILTER CO2 0.2 MICRON (VASCULAR PRODUCTS) ×2 IMPLANT
KIT MICROPUNCTURE NIT STIFF (SHEATH) ×2 IMPLANT
KIT PV (KITS) ×2 IMPLANT
RESERVOIR CO2 (VASCULAR PRODUCTS) ×2 IMPLANT
SET FLUSH CO2 (MISCELLANEOUS) ×2 IMPLANT
SHEATH PINNACLE 5F 10CM (SHEATH) ×2 IMPLANT
SHEATH PROBE COVER 6X72 (BAG) ×2 IMPLANT
SYR MEDRAD MARK V 150ML (SYRINGE) ×2 IMPLANT
TRANSDUCER W/STOPCOCK (MISCELLANEOUS) ×2 IMPLANT
TRAY PV CATH (CUSTOM PROCEDURE TRAY) ×2 IMPLANT
WIRE BENTSON .035X145CM (WIRE) ×2 IMPLANT

## 2018-05-13 NOTE — H&P (Signed)
   History and Physical Update  The patient was interviewed and re-examined.  The patient's previous History and Physical has been reviewed and is unchanged from recent office visit with Dr. Donnetta Hutching. Plan for angiogram today to evaluate left lower extremity.  Libbi Towner C. Donzetta Matters, MD Vascular and Vein Specialists of [place] Office: [phone] Pager: [phone]   05/13/2018, 8:40 AM

## 2018-05-13 NOTE — Telephone Encounter (Signed)
sch appt phone NA mld ltr 06/14/18 1pm wound check

## 2018-05-13 NOTE — Progress Notes (Signed)
I stat recollected and ran due to Potassium level - Repeat potassium was 5.3

## 2018-05-13 NOTE — Op Note (Signed)
    Patient name: Edwin [name] MRN: [id number] DOB: 1946-06-18 Sex: male  05/13/2018 Pre-operative Diagnosis: Critical left lower extremity ischemia Post-operative diagnosis:  Same Surgeon:  Erlene Quan C. Donzetta Matters, MD Procedure Performed: 1.  Ultrasound-guided cannulation right common femoral artery 2.  CO2 aortogram 3.  Selection left common femoral artery 4.  Left lower extremity angiogram with CO2 and contrast 5.  Minx device closure of right common femoral artery percutaneous access site  Indications: 72 year old male who is a nursing home resident presented with left lower extremity pain now has toe tip ulceration on the left.  He is indicated for angiogram possible intervention on the left side.  He has an elevated creatinine that is stable at 1.5 and we will use CO2.  Findings: Aorta and iliac segments appear free of disease.  Common femoral artery is free of flow-limiting stenosis.  Left SFA occludes for a short segment reconstitutes above the adductor and is heavily calcified.  He is patent down to the popliteal level where he has very diminutive peroneal artery that is the dominant runoff but does occlude mid calf and reconstitutes but there appears to be no flow into his foot.  Given his inability to lie still he has multilevel disease in his severe dementia I elected to not intervene.  We will follow him for wounds and he will possibly need an amputation if his wounds progress per   Procedure:  The patient was identified in the holding area and taken to room 8.  The patient was then placed supine on the table and prepped and draped in the usual sterile fashion.  A time out was called.  Ultrasound was used to evaluate the right common femoral artery this was cannulated directly with micropuncture needle.  An image was saved the permanent record.  We placed a 5 French sheath on the catheter and perform CO2 aortogram.  I then crossed the bifurcation placed catheter into the left common  femoral artery perform left lower extremity angiogram first with CO2 but the patient could not tolerate this.  We then used minimal contrast injections he also moved quite a bit on the distal picture but we were able to improve the picture in the back room which demonstrates a peroneal artery that does appear to occlude but reconstitutes but no flow appears to go into the foot.  I did not want to give him more contrast given his underlying kidney dysfunction.  I think that his best option would be primary amputation if he has progressive wounds or if he has ischemic rest pain that he cannot tolerate.  A minx device was closed in his right groin did appear to obtain hemostasis.  He tolerated the procedure mostly well although he would not lie still.  Contrast 30 cc.    Nikolas Casher C. Donzetta Matters, MD V[facility] and Vein Specialists of [place] Office: [phone] Pager: [phone]

## 2018-05-13 NOTE — H&P (Deleted)
   History and Physical Update  The patient was interviewed and re-examined.  The patient's previous History and Physical has been reviewed and is unchanged from office visit with Dr. Donnetta Hutching. Plan for angiogram looking at left lower extremity.  05/13/2018, 10:59 AM

## 2018-05-13 NOTE — Progress Notes (Signed)
Brother and sister arrived. Both are next of kin.  Brother Robin Searing signed consent form.  Cell # [phone] if needed.

## 2018-05-13 NOTE — Progress Notes (Signed)
Spoke with Edwin [name] at [facility] to update med rec and review allergies

## 2018-05-13 NOTE — Discharge Instructions (Signed)

## 2018-05-14 ENCOUNTER — Encounter (HOSPITAL_COMMUNITY): Payer: Self-pay | Admitting: Vascular Surgery

## 2018-06-14 ENCOUNTER — Ambulatory Visit: Payer: Medicare Other

## 2018-06-14 NOTE — Progress Notes (Deleted)
[facility] [place] HISTORY AND PHYSICAL   History of Present Illness:  Edwin [name] is a 72 y.o. male, who is here today for evaluation of ischemia of his left leg.  He is a very difficult management situation.  He is here today with 2 staff members from [facility] facility.  They are providing history since he has severe dementia.  He is currently in a wheelchair.  He apparently had been walking but now does not walk due to complaints of left foot pain.  The patient does report that he is having pain in his foot when I asked him directly.  No history of tissue loss.  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   He underwent angiogram by Dr. Donzetta Matters which revealed Findings: Aorta and iliac segments appear free of disease.  Common femoral artery is free of flow-limiting stenosis.  Left SFA occludes for a short segment reconstitutes above the adductor and is heavily calcified.  He is patent down to the popliteal level where he has very diminutive peroneal artery that is the dominant runoff but does occlude mid calf and reconstitutes but there appears to be no flow into his foot.  There are no vascular intervention options to improve his arterial flow.  He is here today for f/u s/p angiogram.     Past Medical History:  Diagnosis Date  . Anxiety   . Chest pain   . COPD (chronic obstructive pulmonary disease) ([place])   . Diabetes mellitus without complication ([place])   . PVD (peripheral vascular disease) (HCC)    Severe with occlusion of L PTA, ATA, and DPA, L extremity  . 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     Past Surgical History:  Procedure Laterality Date  . ABDOMINAL AORTOGRAM W/LOWER EXTREMITY N/A 05/13/2018   Procedure: ABDOMINAL AORTOGRAM W/LOWER EXTREMITY;  Surgeon: Waynetta Sandy, MD;  Location: [facility];  Service: Cardiovascular;  Laterality: N/A;    ROS:   General:  No weight loss, Fever, chills  HEENT: No recent headaches, no nasal bleeding, no visual changes, no sore throat  Neurologic: No dizziness, blackouts, seizures. No recent symptoms of stroke or mini- stroke. No recent episodes of slurred speech, or temporary blindness.  Cardiac: No recent episodes of chest pain/pressure, no shortness of breath at rest.  No shortness of breath with exertion.  Denies history of atrial fibrillation or irregular heartbeat  Vascular: No history of rest pain in feet.  No history of claudication.  No history of non-healing ulcer, No history of DVT   Pulmonary: No home oxygen, no productive cough, no hemoptysis,  No asthma or wheezing  Musculoskeletal:  [ ]  Arthritis, [ ]  Low back pain,  [ ]  Joint pain  Hematologic:No history of hypercoagulable state.  No history of easy bleeding.  No history of anemia  Gastrointestinal: No hematochezia or melena,  No gastroesophageal reflux, no trouble swallowing  Urinary: [ ]  chronic Kidney disease, [ ]  on HD - [ ]  MWF or [ ]  TTHS, [ ]  Burning with urination, [ ]  Frequent urination, [ ]  Difficulty urinating;   Skin: No rashes  Psychological: No history of anxiety,  No history of depression  Social History Social History   Tobacco Use  . Smoking status: Former Smoker    Types: Cigarettes  . Smokeless tobacco: Never Used  Substance Use Topics  . Alcohol use: No    Alcohol/week: 0.0 standard drinks  . Drug use: No    Family History Family History  Family history unknown: Yes    Allergies  No Known Allergies   Current Outpatient Medications  Medication Sig Dispense Refill  . acetaminophen (TYLENOL) 500 MG tablet Take 500 mg by mouth every 4 (four) hours as needed for moderate pain.    Marland Kitchen alum & mag hydroxide-simeth (MINTOX) 175-102-58 MG/5ML suspension Take 30 mLs by mouth every 6 (six) hours as needed for indigestion or heartburn.    Marland Kitchen  amLODipine (NORVASC) 5 MG tablet Take 1 tablet (5 mg total) by mouth daily. 30 tablet 0  . aspirin 81 MG chewable tablet Chew 81 mg by mouth daily.    . cetirizine (ZYRTEC) 10 MG tablet Take 10 mg by mouth daily as needed for allergies.    . clindamycin (CLEOCIN T) 1 % external solution Apply 1 application topically 2 (two) times daily as needed (pustules).    . famotidine (PEPCID) 20 MG tablet Take 1 tablet (20 mg total) by mouth 2 (two) times daily. 30 tablet 0  . Fluticasone-Salmeterol (WIXELA INHUB) 250-50 MCG/DOSE AEPB Inhale 1 puff into the lungs 2 (two) times daily.    Marland Kitchen gabapentin (NEURONTIN) 100 MG capsule Take 100 mg by mouth 3 (three) times daily.     . insulin aspart (NOVOLOG) 100 UNIT/ML injection Inject 0-9 Units into the skin 3 (three) times daily with meals. (Patient not taking: Reported on 07/16/2017) 10 mL 11  . ipratropium-albuterol (DUONEB) 0.5-2.5 (3) MG/3ML SOLN Take 3 mLs by nebulization every 4 (four) hours as needed (sob and wheezing).    Marland Kitchen loperamide (IMODIUM) 2 MG capsule Take 2 mg by mouth as needed for diarrhea or loose stools.    . magnesium hydroxide (MILK OF MAGNESIA) 400 MG/5ML suspension Take 30 mLs by mouth at bedtime as needed for mild constipation.     . metFORMIN (GLUCOPHAGE) 1000 MG tablet Take 1,000 mg by mouth 2 (two) times daily with a meal.    . MINERAL OIL LIGHT EX Apply 1 application topically daily as needed (dry skin).     . mirtazapine (REMERON) 7.5 MG tablet Take 7.5 mg by mouth at bedtime.    Marland Kitchen neomycin-bacitracin-polymyxin (NEOSPORIN) [phone] ointment Apply 1 application topically daily as needed (skin tear).    . Nutritional Supplements (NUTRITIONAL SHAKE) LIQD Take 1 each by mouth 3 (three) times daily.    . QUEtiapine (SEROQUEL) 25 MG tablet Take 75 mg by mouth at bedtime.     . sertraline (ZOLOFT) 50 MG tablet Take 75 mg by mouth daily.    . simvastatin (ZOCOR) 20 MG tablet Take 1 tablet (20 mg total) by mouth every evening. 30 tablet 0  .  traMADol (ULTRAM) 50 MG tablet Take 50 mg by mouth at bedtime.     . triamterene-hydrochlorothiazide (MAXZIDE-25) 37.5-25 MG per tablet Take 0.5 tablets by mouth daily. 30 tablet 0   No current facility-administered medications for this visit.     Physical Examination  There were no vitals filed for this visit.  There is no height or weight on file to calculate BMI.  General:  Alert and oriented, no acute distress HEENT: Normal Neck: No bruit or JVD Pulmonary: Clear to auscultation bilaterally Cardiac: Regular Rate and Rhythm without murmur Abdomen: Soft, non-tender, non-distended, no mass, no scars Skin: No rash Extremity Pulses:  2+ radial, brachial, femoral, dorsalis pedis, posterior tibial pulses  bilaterally Musculoskeletal: No deformity or edema  Neurologic: Upper and lower extremity motor 5/5 and symmetric  DATA: ***   ASSESSMENT: ***   PLAN: ***   Roxy Horseman PA-C [facility] Office: [phone]

## 2018-08-03 ENCOUNTER — Emergency Department (HOSPITAL_COMMUNITY): Payer: Medicare Other

## 2018-08-03 ENCOUNTER — Encounter (HOSPITAL_COMMUNITY): Payer: Self-pay | Admitting: Radiology

## 2018-08-03 ENCOUNTER — Emergency Department (HOSPITAL_COMMUNITY)
Admission: EM | Admit: 2018-08-03 | Discharge: 2018-08-04 | Disposition: A | Discharge status: Home / Self Care | Payer: Medicare Other | Attending: Emergency Medicine | Admitting: Emergency Medicine

## 2018-08-03 ENCOUNTER — Other Ambulatory Visit: Payer: Self-pay

## 2018-08-03 DIAGNOSIS — R05 Cough: Secondary | ICD-10-CM | POA: Insufficient documentation

## 2018-08-03 DIAGNOSIS — C3490 Malignant neoplasm of unspecified part of unspecified bronchus or lung: Secondary | ICD-10-CM | POA: Diagnosis not present

## 2018-08-03 DIAGNOSIS — R1110 Vomiting, unspecified: Secondary | ICD-10-CM | POA: Diagnosis present

## 2018-08-03 DIAGNOSIS — J449 Chronic obstructive pulmonary disease, unspecified: Secondary | ICD-10-CM | POA: Diagnosis not present

## 2018-08-03 DIAGNOSIS — Z87891 Personal history of nicotine dependence: Secondary | ICD-10-CM | POA: Insufficient documentation

## 2018-08-03 DIAGNOSIS — R062 Wheezing: Secondary | ICD-10-CM

## 2018-08-03 DIAGNOSIS — Z79899 Other long term (current) drug therapy: Secondary | ICD-10-CM | POA: Insufficient documentation

## 2018-08-03 DIAGNOSIS — F0390 Unspecified dementia without behavioral disturbance: Secondary | ICD-10-CM | POA: Diagnosis not present

## 2018-08-03 DIAGNOSIS — R059 Cough, unspecified: Secondary | ICD-10-CM

## 2018-08-03 DIAGNOSIS — E119 Type 2 diabetes mellitus without complications: Secondary | ICD-10-CM | POA: Diagnosis not present

## 2018-08-03 LAB — CBC WITH DIFFERENTIAL/PLATELET
Abs Immature Granulocytes: 0.08 10*3/uL — ABNORMAL HIGH (ref 0.00–0.07)
BASOS ABS: 0 10*3/uL (ref 0.0–0.1)
BASOS PCT: 0 %
EOS ABS: 0.3 10*3/uL (ref 0.0–0.5)
Eosinophils Relative: 2 %
HEMATOCRIT: 38.1 % — AB (ref 39.0–52.0)
Hemoglobin: 11.8 g/dL — ABNORMAL LOW (ref 13.0–17.0)
IMMATURE GRANULOCYTES: 1 %
LYMPHS ABS: 1.9 10*3/uL (ref 0.7–4.0)
Lymphocytes Relative: 13 %
MCH: 28.5 pg (ref 26.0–34.0)
MCHC: 31 g/dL (ref 30.0–36.0)
MCV: 92 fL (ref 80.0–100.0)
Monocytes Absolute: 0.6 10*3/uL (ref 0.1–1.0)
Monocytes Relative: 4 %
NEUTROS PCT: 80 %
NRBC: 0 % (ref 0.0–0.2)
Neutro Abs: 11.7 10*3/uL — ABNORMAL HIGH (ref 1.7–7.7)
PLATELETS: 285 10*3/uL (ref 150–400)
RBC: 4.14 MIL/uL — ABNORMAL LOW (ref 4.22–5.81)
RDW: 13.9 % (ref 11.5–15.5)
WBC: 14.5 10*3/uL — AB (ref 4.0–10.5)

## 2018-08-03 LAB — COMPREHENSIVE METABOLIC PANEL
ALK PHOS: 66 U/L (ref 38–126)
ALT: 10 U/L (ref 0–44)
ANION GAP: 12 (ref 5–15)
AST: 20 U/L (ref 15–41)
Albumin: 4.2 g/dL (ref 3.5–5.0)
BILIRUBIN TOTAL: 0.3 mg/dL (ref 0.3–1.2)
BUN: 33 mg/dL — ABNORMAL HIGH (ref 8–23)
CALCIUM: 9.4 mg/dL (ref 8.9–10.3)
CO2: 27 mmol/L (ref 22–32)
CREATININE: 1.95 mg/dL — AB (ref 0.61–1.24)
Chloride: 102 mmol/L (ref 98–111)
GFR, EST AFRICAN AMERICAN: 38 mL/min — AB (ref >60)
GFR, EST NON AFRICAN AMERICAN: 33 mL/min — AB (ref >60)
Glucose, Bld: 106 mg/dL — ABNORMAL HIGH (ref 70–99)
Potassium: 4.5 mmol/L (ref 3.5–5.1)
Sodium: 141 mmol/L (ref 135–145)
TOTAL PROTEIN: 8.3 g/dL — AB (ref 6.5–8.1)

## 2018-08-03 LAB — LIPASE, BLOOD: Lipase: 66 U/L — ABNORMAL HIGH (ref 11–51)

## 2018-08-03 LAB — CBG MONITORING, ED: GLUCOSE-CAPILLARY: 106 mg/dL — AB (ref 70–99)

## 2018-08-03 MED ORDER — SODIUM CHLORIDE 0.9 % IJ SOLN
INTRAMUSCULAR | Status: AC
  Filled 2018-08-03: qty 50

## 2018-08-03 MED ORDER — IOHEXOL 300 MG/ML  SOLN: 80.0000 mL | Freq: Once | INTRAMUSCULAR | Status: DC | PRN

## 2018-08-03 MED ORDER — METHYLPREDNISOLONE SODIUM SUCC 125 MG IJ SOLR
125.0000 mg | Freq: Once | INTRAMUSCULAR | Status: AC
  Administered 2018-08-03: 125 mg via INTRAVENOUS
  Filled 2018-08-03: qty 2

## 2018-08-03 MED ORDER — IPRATROPIUM-ALBUTEROL 0.5-2.5 (3) MG/3ML IN SOLN
3.0000 mL | Freq: Once | RESPIRATORY_TRACT | Status: AC
  Administered 2018-08-03: 3 mL via RESPIRATORY_TRACT
  Filled 2018-08-03: qty 3

## 2018-08-03 MED ORDER — IOPAMIDOL (ISOVUE-300) INJECTION 61%
INTRAVENOUS | Status: AC
  Filled 2018-08-03: qty 100

## 2018-08-03 MED ORDER — IOPAMIDOL (ISOVUE-M 300) INJECTION 61%: 15.0000 mL | Freq: Once | INTRAMUSCULAR | Status: DC | PRN

## 2018-08-03 MED ORDER — IOPAMIDOL (ISOVUE-300) INJECTION 61%
80.0000 mL | Freq: Once | INTRAVENOUS | Status: AC | PRN
  Administered 2018-08-03: 80 mL via INTRAVENOUS

## 2018-08-03 MED ORDER — IOHEXOL 300 MG/ML  SOLN
30.0000 mL | Freq: Once | INTRAMUSCULAR | Status: AC | PRN
  Administered 2018-08-03: 30 mL via ORAL

## 2018-08-03 NOTE — ED Notes (Signed)
Pt is drinking CT oral contrast w/a straw and tolerating well.  Call bell is in reach and TV is on for pt to be able to watch.

## 2018-08-03 NOTE — ED Provider Notes (Signed)
[facility] DEPT Provider Note   CSN: [id number] Arrival date & time: 08/03/18  1920     History   Chief Complaint Chief Complaint  Patient presents with  . Emesis    HPI Edwin [name] is a 72 y.o. male with PMH/o Alzheimer's COPD, PVD, DM BIB EMS for evaluation of vomiting.  Per EMS, staff at nursing home reported that patient had episodes of vomiting after dinner at about 5 PM.  Patient states that all Edwin [name] remembers eating dinner but states Edwin [name] was not able to eat all of it because Edwin [name] threw up.    EM LEVEL 5 CAVEAT DUE TO DEMENTIAL     Past Medical History:  Diagnosis Date  . Anxiety   . Chest pain   . COPD (chronic obstructive pulmonary disease) ([place])   . Diabetes mellitus without complication ([place])   . PVD (peripheral vascular disease) (HCC)    Severe with occlusion of L PTA, ATA, and DPA, L extremity  . 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     Patient Active Problem List   Diagnosis Date Noted  . Failure to thrive in adult   . 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 Encounter for preadmission testing   . Hyperglycemia 02/23/2015  . Renal insufficiency 02/23/2015  . DM 10/02/2007  . HYPERTENSION 10/02/2007  . MYOCARDIAL INFARCTION 10/02/2007  . ALLERGIC RHINITIS 10/02/2007  . EMPHYSEMA 10/02/2007  . COPD 10/02/2007  . PULMONARY NODULE 10/02/2007  . HEADACHE, CHRONIC 10/02/2007  . HYPERGLYCEMIA 10/02/2007    Past Surgical History:  Procedure Laterality Date  . ABDOMINAL AORTOGRAM W/LOWER EXTREMITY N/A 05/13/2018   Procedure: ABDOMINAL AORTOGRAM W/LOWER EXTREMITY;  Surgeon: Waynetta Sandy, MD;  Location: [facility];  Service: Cardiovascular;  Laterality: N/A;        Home Medications    Prior to Admission medications   Medication Sig Start Date End Date Taking? Authorizing Provider  acetaminophen  (TYLENOL) 500 MG tablet Take 500 mg by mouth every 4 (four) hours as needed for moderate pain.   Yes [provider]  alum & mag hydroxide-simeth ([place]) 200-200-20 MG/5ML suspension Take 30 mLs by mouth every 6 (six) hours as needed for indigestion or heartburn.   Yes [provider]  amLODipine (NORVASC) 5 MG tablet Take 1 tablet (5 mg total) by mouth daily. 03/01/15  Yes Delfin Gant, NP  aspirin 81 MG chewable tablet Chew 81 mg by mouth daily.   Yes [provider]  cetirizine (ZYRTEC) 10 MG tablet Take 10 mg by mouth daily as needed for allergies.   Yes [provider]  clindamycin (CLEOCIN T) 1 % external solution Apply 1 application topically 2 (two) times daily as needed (pustules).   Yes [provider]  famotidine (PEPCID) 20 MG tablet Take 1 tablet (20 mg total) by mouth 2 (two) times daily. Patient taking differently: Take 20 mg by mouth at bedtime.  03/01/15  Yes Charmaine Downs C, NP  Fluticasone-Salmeterol (WIXELA INHUB) 250-50 MCG/DOSE AEPB Inhale 1 puff into the lungs 2 (two) times daily.   Yes [provider]  gabapentin (NEURONTIN) 100 MG capsule Take 100 mg by mouth 3 (three) times daily.    Yes [provider]  guaiFENesin (ROBITUSSIN) 100 MG/5ML liquid Take 200 mg by mouth every 6 (six) hours as needed for cough.   Yes [provider]  ipratropium-albuterol (DUONEB)  0.5-2.5 (3) MG/3ML SOLN Take 3 mLs by nebulization every 4 (four) hours as needed (sob and wheezing).   Yes [provider]  loperamide (IMODIUM) 2 MG capsule Take 2 mg by mouth as needed for diarrhea or loose stools.   Yes [provider]  LORazepam (ATIVAN) 0.5 MG tablet Take 0.25 mg by mouth 3 (three) times daily as needed for anxiety. 07/24/18  Yes [provider]  LORazepam (ATIVAN) 0.5 MG tablet Take 0.5 mg by mouth 2 (two) times daily.   Yes [provider]  magnesium hydroxide (MILK OF MAGNESIA) 400  MG/5ML suspension Take 30 mLs by mouth at bedtime as needed for mild constipation.    Yes [provider]  metFORMIN (GLUCOPHAGE) 1000 MG tablet Take 1,000 mg by mouth 2 (two) times daily with a meal.   Yes [provider]  MINERAL OIL LIGHT EX Apply 1 application topically daily as needed (dry skin).    Yes [provider]  mirtazapine (REMERON) 7.5 MG tablet Take 7.5 mg by mouth at bedtime.   Yes [provider]  neomycin-bacitracin-polymyxin (NEOSPORIN) [phone] ointment Apply 1 application topically daily as needed (skin tear).   Yes [provider]  Nutritional Supplements (NUTRITIONAL SHAKE) LIQD Take 1 each by mouth 3 (three) times daily.   Yes [provider]  QUEtiapine (SEROQUEL) 25 MG tablet Take 75 mg by mouth at bedtime.  03/24/18  Yes [provider]  sertraline (ZOLOFT) 50 MG tablet Take 75 mg by mouth daily.   Yes [provider]  simvastatin (ZOCOR) 20 MG tablet Take 1 tablet (20 mg total) by mouth every evening. 03/01/15  Yes Charmaine Downs C, NP  traMADol (ULTRAM) 50 MG tablet Take 50 mg by mouth at bedtime.  04/15/18  Yes [provider]  triamterene-hydrochlorothiazide (MAXZIDE-25) 37.5-25 MG per tablet Take 0.5 tablets by mouth daily. 03/01/15  Yes Delfin Gant, NP    Family History Family History  Family history unknown: Yes    Social History Social History   Tobacco Use  . Smoking status: Former Smoker    Types: Cigarettes  . Smokeless tobacco: Never Used  Substance Use Topics  . Alcohol use: No    Alcohol/week: 0.0 standard drinks  . Drug use: No     Allergies   Patient has no known allergies.   Review of Systems Review of Systems  Unable to perform ROS: Dementia     Physical Exam Updated Vital Signs BP (!) 136/99 (BP Location: Right Arm)   Pulse 99   Temp 98 F (36.7 C) (Oral)   Resp 20   SpO2 94%   Physical Exam  Constitutional: Edwin [name] is oriented to person,  place, and time.  Frail and elderly appearing  HENT:  Head: Normocephalic and atraumatic.  Mouth/Throat: Oropharynx is clear and moist and mucous membranes are normal.  Eyes: Pupils are equal, round, and reactive to light. Conjunctivae, EOM and lids are normal.  Neck: Full passive range of motion without pain.  Cardiovascular: Normal rate, regular rhythm, normal heart sounds and normal pulses. Exam reveals no gallop and no friction rub.  No murmur heard. Pulmonary/Chest: Effort normal. Edwin [name] has wheezes.  Harsh breath sounds noted with audible wheezing.  Abdominal: Soft. Normal appearance. There is no tenderness. There is no rigidity, no guarding and no CVA tenderness.  Abdomen is soft, non-distended, non-tender. No rigidity, No guarding. No peritoneal signs.  No CVA tenderness bilaterally.  Musculoskeletal: Normal range of motion.  Neurological: Edwin [name]  is alert and oriented to person, place, and time.  Skin: Skin is warm and dry. Capillary refill takes less than 2 seconds.  Psychiatric: Edwin [name] has a normal mood and affect. His speech is normal.  Nursing note and vitals reviewed.    ED Treatments / Results  Labs (all labs ordered are listed, but only abnormal results are displayed) Labs Reviewed  COMPREHENSIVE METABOLIC PANEL - Abnormal; Notable for the following components:      Result Value   Glucose, Bld 106 (*)    BUN 33 (*)    Creatinine, Ser 1.95 (*)    Total Protein 8.3 (*)    GFR calc non Af Amer 33 (*)    GFR calc Af Amer 38 (*)    All other components within normal limits  CBC WITH DIFFERENTIAL/PLATELET - Abnormal; Notable for the following components:   WBC 14.5 (*)    RBC 4.14 (*)    Hemoglobin 11.8 (*)    HCT 38.1 (*)    Neutro Abs 11.7 (*)    Abs Immature Granulocytes 0.08 (*)    All other components within normal limits  LIPASE, BLOOD - Abnormal; Notable for the following components:   Lipase 66 (*)    All other components within normal limits  CBG MONITORING, ED -  Abnormal; Notable for the following components:   Glucose-Capillary 106 (*)    All other components within normal limits  URINALYSIS, ROUTINE W REFLEX MICROSCOPIC    EKG None  Radiology Dg Chest 2 View  Result Date: 08/03/2018 CLINICAL DATA:  Cough and wheezing.  Nausea and vomiting.  COPD. EXAM: CHEST - 2 VIEW COMPARISON:  07/16/2017 FINDINGS: The heart size and mediastinal contours are within normal limits. Pulmonary hyperinflation again seen, consistent with COPD. A spiculated mass is seen in the right upper lobe measuring 3.4 cm which is new since previous study, and highly suspicious for bronchogenic carcinoma. No evidence of pulmonary infiltrate or pleural effusion. IMPRESSION: New 3.4 cm spiculated mass in right upper lobe, highly suspicious for bronchogenic carcinoma. Recommend chest CT with contrast for further evaluation. COPD. Electronically Signed   By: Earle Gell M.D.   On: 08/03/2018 21:49    Procedures Procedures (including critical care time)  Medications Ordered in ED Medications  iohexol (OMNIPAQUE) 300 MG/ML solution 30 mL (has no administration in time range)  ipratropium-albuterol (DUONEB) 0.5-2.5 (3) MG/3ML nebulizer solution 3 mL (3 mLs Nebulization Given 08/03/18 2042)  methylPREDNISolone sodium succinate (SOLU-MEDROL) 125 mg/2 mL injection 125 mg (125 mg Intravenous Given 08/03/18 2042)     Initial Impression / Assessment and Plan / ED Course  I have reviewed the triage vital signs and the nursing notes.  Pertinent labs & imaging results that were available during my care of the patient were reviewed by me and considered in my medical decision making (see chart for details).     72 year old male with past medical history of dementia, diabetes, COPD brought in by BMS for evaluation of vomiting since 5 PM.  Attempted to call [facility] x4 for further evaluation but they did not answer the phone.  CBC shows leukocytosis of 14.5.  Hemoglobin is 11.8,  hematocrit is 38.1.  Lipase is slightly elevated at 66.  CMP shows BUN of 33, creatinine of 1.95.  Review of patient's records show been and is at baseline.  Creatinine seems to fluctuate between 1.40 and 1.9.  Patient's history of dementia, vomiting, will plan for CT on pelvis to rule any intra-abdominal pathology.  RN informed me that on insertion of the in and out cath for urine sample, there was hematuria.  Chest x-ray shows new 3.4 cm spiculated mass in the right upper lobe.  Highly suspicious for bronchogenic carcinoma.  Given vomiting, history of dementia, will plan for CT abdomen pelvis for evaluation of intra-abdominal pathology.  Patient signed out to Lorre Munroe, PA-C with CT abdomen pelvis pending.  Please see note for further ED course.  Final Clinical Impressions(s) / ED Diagnoses   Final diagnoses:  Non-intractable vomiting, presence of nausea not specified, unspecified vomiting type    ED Discharge Orders    None       Volanda Napoleon, PA-C 08/03/18 2324    Orlie Dakin, MD 08/04/18 0002

## 2018-08-03 NOTE — ED Triage Notes (Signed)
Pt from [facility] by EMS.  Staff states pt has been vomiting since after dinner around 5pm.  No other residents reported to be ill.  No emesis while en route.  Hx alzheimers & COPD.  Just c/o nausea w/ems at this time.   VS: 110/78, 90, 18, CBG 103.

## 2018-08-03 NOTE — ED Notes (Signed)
Bed: GY18 Expected date:  Expected time:  Means of arrival:  Comments: 72 yo M/ NV

## 2018-08-03 NOTE — ED Notes (Addendum)
Attempted in/out cath.  Followed hospital protocol.  No obstruction felt as the catheter was advanced.  Blood but no urine was returned.  Pt did c/o pain w/advancement as is typically experienced but nothing out of the ordinary. Informed EDP.  Pt did have a brief on that was urine soaked.

## 2018-08-03 NOTE — ED Notes (Signed)
Pt reminded to drink his CT oral contrast, which he did w/o difficulty.  Lights have been turned out for pt comfort.  Has access to call bell.

## 2018-08-03 NOTE — ED Provider Notes (Signed)
Patient reports he was vomiting earlier today he denies nausea presently.  He states his breathing is "normal" on exam he appears in no distress.  Lungs with expiratory wheezes heart regular rate and rhythm abdomen nondistended, nontender   Orlie Dakin, MD 08/03/18 2052

## 2018-08-04 NOTE — ED Provider Notes (Addendum)
Patient signed out to me at shift change.  Patient with dementia.  Unable to obtain much history during initial history and physical.  We were able to reach the nursing home, who said that the patient was sent over for some wheezing and some coughing.  Patient was given a nebulizer treatment here.  His O2 saturation drops to the low 90s when he is asleep, but when he is speaking with me, his oxygen saturation is 94 to 96%.  His lung sounds are diminished on my exam, but there is no wheezing.  He is in no acute distress.  CT scan of his chest abdomen and pelvis was ordered by prior team, which is remarkable for findings consistent with bronchogenic carcinoma.  This appears to be a new finding in this patient.  I did relate the findings to the patient, but unclear with his dementia how much he understood.  He did seem to be comprehending what I was telling him, and responded to all my questions appropriately.  Specific and detailed instructions given on discharge AVS.    Montine Circle, PA-C 08/04/18 0123    Orlie Dakin, MD 08/04/18 1459

## 2018-08-04 NOTE — Discharge Instructions (Addendum)
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  You need to follow up with the oncologist and pulmonologist listed below.  Please call and schedule an appointment.

## 2018-08-06 ENCOUNTER — Telehealth: Payer: Self-pay | Admitting: Internal Medicine

## 2018-08-06 NOTE — Telephone Encounter (Signed)
Pt has been scheduled to see Dr. Julien Nordmann on 10/29 at 215pm. Spoke to a representative from [facility] and provided the date and time.

## 2018-08-19 ENCOUNTER — Other Ambulatory Visit: Payer: Self-pay | Admitting: Medical Oncology

## 2018-08-19 DIAGNOSIS — R918 Other nonspecific abnormal finding of lung field: Secondary | ICD-10-CM

## 2018-08-20 ENCOUNTER — Inpatient Hospital Stay: Payer: Medicare Other | Admitting: Internal Medicine

## 2018-08-20 ENCOUNTER — Telehealth: Payer: Self-pay | Admitting: *Deleted

## 2018-08-20 ENCOUNTER — Inpatient Hospital Stay: Payer: Medicare Other

## 2018-08-20 NOTE — Telephone Encounter (Signed)
Oncology Nurse Navigator Documentation  Oncology Nurse Navigator Flowsheets 08/20/2018  Navigator Location [facility]  Navigator Encounter Type Telephone/I called patient's facility to check on why he was a no show today.  I spoke with caregiver and she states she called cancer center to cancel this appt and would like to re-schedule.  Due to patient being in memory care the family needs to attend and they would like morning appt. I will update new patient coordinator to call and schedule this for them with a different provider.  Care giver verbalized understanding  Telephone Outgoing Call  Treatment Phase Abnormal Scans  Barriers/Navigation Needs Coordination of Care  Interventions Coordination of Care  Coordination of Care Other  Acuity Level 1  Time Spent with Patient 15

## 2018-08-23 DIAGNOSIS — R918 Other nonspecific abnormal finding of lung field: Secondary | ICD-10-CM | POA: Insufficient documentation

## 2018-08-28 ENCOUNTER — Inpatient Hospital Stay: Payer: Medicare Other | Attending: Internal Medicine | Admitting: Hematology

## 2018-08-28 ENCOUNTER — Telehealth: Payer: Self-pay | Admitting: *Deleted

## 2018-08-28 DIAGNOSIS — G309 Alzheimer's disease, unspecified: Secondary | ICD-10-CM | POA: Insufficient documentation

## 2018-08-28 DIAGNOSIS — R918 Other nonspecific abnormal finding of lung field: Secondary | ICD-10-CM | POA: Insufficient documentation

## 2018-08-28 DIAGNOSIS — Z87891 Personal history of nicotine dependence: Secondary | ICD-10-CM | POA: Insufficient documentation

## 2018-08-28 DIAGNOSIS — N183 Chronic kidney disease, stage 3 (moderate): Secondary | ICD-10-CM | POA: Insufficient documentation

## 2018-08-28 DIAGNOSIS — Z7189 Other specified counseling: Secondary | ICD-10-CM | POA: Insufficient documentation

## 2018-08-28 DIAGNOSIS — R627 Adult failure to thrive: Secondary | ICD-10-CM | POA: Insufficient documentation

## 2018-08-28 NOTE — Telephone Encounter (Signed)
Oncology Nurse Navigator Documentation  Oncology Nurse Navigator Flowsheets 08/28/2018  Navigator Location [facility]  Navigator Encounter Type Telephone/Edwin [name] was a no show today for his appt. I called his facility to check on status.  I was unable to reach anyone but did leave a vm message with my name and phone number to call.   Telephone Outgoing Call  Treatment Phase Abnormal Scans  Barriers/Navigation Needs Coordination of Care  Interventions Coordination of Care  Coordination of Care Other  Acuity Level 1  Time Spent with Patient 15

## 2018-08-30 ENCOUNTER — Telehealth: Payer: Self-pay | Admitting: *Deleted

## 2018-08-30 NOTE — Telephone Encounter (Signed)
Oncology Nurse Navigator Documentation  Oncology Nurse Navigator Flowsheets 08/30/2018  Navigator Location [facility]  Navigator Encounter Type Telephone/I called to follow up with Edwin [name]. He has been no show to 2 appts.  I left a message for his nurse to call me with an update.   Telephone Outgoing Call  Treatment Phase Abnormal Scans  Barriers/Navigation Needs Coordination of Care  Interventions Coordination of Care  Coordination of Care Other  Acuity Level 1  Time Spent with Patient 15

## 2018-09-05 NOTE — Progress Notes (Signed)
[place] CONSULT NOTE  Patient Care Team: System, Pcp Not In as PCP - General System, Provider Not In Sande Brothers, MD as Referring Physician (Internal Medicine)  HEME/ONC OVERVIEW: 1. Suspected lung cancer, Stage TBD -07/2018: CT CAP showed RUL mass 3 x 2.3cm and a second left lingular nodule 1.2cm w/ central cavitation; multiple other smaller nodules throughout both lungs, possibly granulomas   PERTINENT NON-HEM/ONC PROBLEMS: 1. Alzheimer's dementia, living in SNF 2. Emphysema  3. Stage III CKD 4. Failure to thrive   ASSESSMENT & PLAN:   Suspected lung cancer, Stage TBD -I reviewed the patient's records in detail, including recent ER visit -I also independently reviewed the radiologic images of recent CT CAP, and agree with findings as documented -In summary, patient was sent from nursing home for wheezing and cough in October 2019, and CT showed a dominant RUL mass 3 x 2.3 cm and the second left lingular nodule 1.2 cm; there were multiple other small nodules throughout both lungs, possibly granulomas; no pathologic hilar or mediastinal lymphadenopathy -The patient has severe dementia, chronic kidney disease, and significant cachexia (weight <50 kg), in addition to other medical comorbidities, and his ECOG is between 3 and 4, making him a very poor candidate for any invasive biopsy, chemotherapy or radiation, as the risks of these procedures/treatments likely outweigh the benefits -Therefore, I do not recommend any further biopsy, chemotherapy or radiation treatment at this time -See goals of care discussion below   Alzheimer's dementia -Patient is not oriented to place, date, or situation; per family members, he spends his day in a chair/bed at the nursing home  Stage III CKD -Cr between 1.9 and 2.0 but given his significant cachexia, this likely underestimates the degree of renal dysfunction  Failure to thrive  -Per the patient's family, patient has lost over  19 to 80 pounds over the past year -His appetite is very limited, likely due to his underlying advanced Alzheimer's dementia  Goals of care discussion  -I discussed in detail with the patient's sister and brother regarding the work-up, diagnosis, treatment options, prognosis of lung cancer -In light of the patient's multiple comorbidities, including very advanced Alzheimer's dementia, CKD, and cachexia, as well as his poor performance status, any invasive biopsy/surgery, chemotherapy or radiation would likely cause more harms than benefits -As discussed above, I do not recommend any further biopsy, chemotherapy or radiation treatment -Given the patient's advanced dementia and suspected lung cancer, I encouraged the patient's family to think about goals of care for the patient, including CODE STATUS -I offered the patient's family to meet with palliative care/hospice; the patient's family will like to discus amongst the siblings, and will contact oncology clinic if they would like to request in the referral to palliative care/hospice  A total of more than 60 minutes were spent face-to-face with the patient during this encounter and over half of that time was spent on counseling and coordination of care as outlined above.    All questions were answered. The patient knows to call the clinic with any problems, questions or concerns.  Tish Men, MD 09/11/2018 2:09 PM   CHIEF COMPLAINTS/PURPOSE OF CONSULTATION:  "I am doing fine"  HISTORY OF PRESENTING ILLNESS:  Edwin [name] 72 y.o. male is here because of recently diagnosed bilateral lung masses, concerning for lung cancer.  Patient has very advanced Alzheimer's dementia, and is accompanied by his sister and oldest brother, who provided the history.  Patient was recently seen in the emergency department  for cough, and CT chest incidentally showed RUL mass and a second left lingular mass, concerning for lung cancer.  Patient was referred to  oncology, but had multiple no-shows to clinic appointments.  Patient states that "I feel fine," but per the patient's siblings, he spends all day in a chair or in bed at the nursing home, and is completely dependent for ADLs and IADLs.  He is not oriented to place, date, or situation.  MEDICAL HISTORY:  Past Medical History:  Diagnosis Date  . Anxiety   . Chest pain   . COPD (chronic obstructive pulmonary disease) ([place])   . Diabetes mellitus without complication ([place])   . PVD (peripheral vascular disease) ([facility])    Severe with occlusion of L PTA, ATA, and DPA, L extremity  . 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     SURGICAL HISTORY: Past Surgical History:  Procedure Laterality Date  . ABDOMINAL AORTOGRAM W/LOWER EXTREMITY N/A 05/13/2018   Procedure: ABDOMINAL AORTOGRAM W/LOWER EXTREMITY;  Surgeon: Waynetta Sandy, MD;  Location: [facility];  Service: Cardiovascular;  Laterality: N/A;    SOCIAL HISTORY: Social History   Socioeconomic History  . Marital status: Single    Spouse name: Not on file  . Number of children: Not on file  . Years of education: Not on file  . Highest education level: Not on file  Occupational History  . Not on file  Social Needs  . Financial resource strain: Not on file  . Food insecurity:    Worry: Not on file    Inability: Not on file  . Transportation needs:    Medical: Not on file    Non-medical: Not on file  Tobacco Use  . Smoking status: Former Smoker    Types: Cigarettes  . Smokeless tobacco: Never Used  Substance and Sexual Activity  . Alcohol use: No    Alcohol/week: 0.0 standard drinks  . Drug use: No  . Sexual activity: Not on file  Lifestyle  . Physical activity:    Days per week: Not on file    Minutes per session: Not on file  . Stress: Not on file  Relationships  . Social connections:    Talks on phone: Not on file    Gets  together: Not on file    Attends religious service: Not on file    Active member of club or organization: Not on file    Attends meetings of clubs or organizations: Not on file    Relationship status: Not on file  . Intimate partner violence:    Fear of current or ex partner: Not on file    Emotionally abused: Not on file    Physically abused: Not on file    Forced sexual activity: Not on file  Other Topics Concern  . Not on file  Social History Narrative  . Not on file    FAMILY HISTORY: Family History  Family history unknown: Yes    ALLERGIES:  has No Known Allergies.  MEDICATIONS:  Current Outpatient Medications  Medication Sig Dispense Refill  . acetaminophen (TYLENOL) 500 MG tablet Take 500 mg by mouth every 4 (four) hours as needed for moderate pain.    Marland Kitchen alum & mag hydroxide-simeth (MINTOX) 811-914-78 MG/5ML suspension Take 30 mLs by mouth every 6 (six) hours as needed for indigestion or heartburn.    Marland Kitchen amLODipine (NORVASC) 5 MG tablet Take 1 tablet (  5 mg total) by mouth daily. 30 tablet 0  . aspirin 81 MG chewable tablet Chew 81 mg by mouth daily.    . cetirizine (ZYRTEC) 10 MG tablet Take 10 mg by mouth daily as needed for allergies.    . clindamycin (CLEOCIN T) 1 % external solution Apply 1 application topically 2 (two) times daily as needed (pustules).    . famotidine (PEPCID) 20 MG tablet Take 1 tablet (20 mg total) by mouth 2 (two) times daily. (Patient taking differently: Take 20 mg by mouth at bedtime. ) 30 tablet 0  . Fluticasone-Salmeterol (WIXELA INHUB) 250-50 MCG/DOSE AEPB Inhale 1 puff into the lungs 2 (two) times daily.    Marland Kitchen gabapentin (NEURONTIN) 100 MG capsule Take 100 mg by mouth 3 (three) times daily.     Marland Kitchen guaiFENesin (ROBITUSSIN) 100 MG/5ML liquid Take 200 mg by mouth every 6 (six) hours as needed for cough.    Marland Kitchen ipratropium-albuterol (DUONEB) 0.5-2.5 (3) MG/3ML SOLN Take 3 mLs by nebulization every 4 (four) hours as needed (sob and wheezing).    Marland Kitchen  loperamide (IMODIUM) 2 MG capsule Take 2 mg by mouth as needed for diarrhea or loose stools.    Marland Kitchen LORazepam (ATIVAN) 0.5 MG tablet Take 0.25 mg by mouth 3 (three) times daily as needed for anxiety.    Marland Kitchen LORazepam (ATIVAN) 0.5 MG tablet Take 0.5 mg by mouth 2 (two) times daily.    . magnesium hydroxide (MILK OF MAGNESIA) 400 MG/5ML suspension Take 30 mLs by mouth at bedtime as needed for mild constipation.     . metFORMIN (GLUCOPHAGE) 1000 MG tablet Take 1,000 mg by mouth 2 (two) times daily with a meal.    . MINERAL OIL LIGHT EX Apply 1 application topically daily as needed (dry skin).     . mirtazapine (REMERON) 7.5 MG tablet Take 7.5 mg by mouth at bedtime.    Marland Kitchen neomycin-bacitracin-polymyxin (NEOSPORIN) [phone] ointment Apply 1 application topically daily as needed (skin tear).    . Nutritional Supplements (NUTRITIONAL SHAKE) LIQD Take 1 each by mouth 3 (three) times daily.    . QUEtiapine (SEROQUEL) 25 MG tablet Take 75 mg by mouth at bedtime.     . sertraline (ZOLOFT) 50 MG tablet Take 75 mg by mouth daily.    . simvastatin (ZOCOR) 20 MG tablet Take 1 tablet (20 mg total) by mouth every evening. 30 tablet 0  . traMADol (ULTRAM) 50 MG tablet Take 50 mg by mouth at bedtime.     . triamterene-hydrochlorothiazide (MAXZIDE-25) 37.5-25 MG per tablet Take 0.5 tablets by mouth daily. 30 tablet 0   No current facility-administered medications for this visit.     REVIEW OF SYSTEMS:   Constitutional: ( - ) fevers, ( - )  chills , ( - ) night sweats Eyes: ( - ) blurriness of vision, ( - ) double vision, ( - ) watery eyes Ears, nose, mouth, throat, and face: ( - ) mucositis, ( - ) sore throat Respiratory: ( - ) cough, ( - ) dyspnea, ( - ) wheezes Cardiovascular: ( - ) palpitation, ( - ) chest discomfort, ( - ) lower extremity swelling Gastrointestinal:  ( - ) nausea, ( - ) heartburn, ( - ) change in bowel habits Skin: ( - ) abnormal skin rashes Lymphatics: ( - ) new lymphadenopathy, ( - ) easy  bruising Neurological: ( - ) numbness, ( - ) tingling, ( - ) new weaknesses Behavioral/Psych: ( - ) mood change, ( - )  new changes  All other systems were reviewed with the patient and are negative.  PHYSICAL EXAMINATION: ECOG PERFORMANCE STATUS: 3 - Symptomatic, >50% confined to bed  Vitals:   09/11/18 1313  BP: (!) 104/91  Pulse: 96  Temp: 97.7 F (36.5 C)  SpO2: (!) 84%   There were no vitals filed for this visit.  GENERAL: awake, poor attention, mumbled speech, frail/cachectic, sitting in a wheelchair, disheveled  SKIN: no rashes or significant lesions EYES: conjunctiva are pink and non-injected, sclera clear OROPHARYNX: thick oral secretions NECK: supple, non-tender LYMPH:  no palpable lymphadenopathy in the cervical or axillary LUNGS: coarse breath sounds bilaterally with rales in bilateral lung bases  HEART: regular rate & rhythm, no murmurs, no lower extremity edema ABDOMEN: soft, non-tender, non-distended, normal bowel sounds Musculoskeletal: no cyanosis of digits and no clubbing  PSYCH: awake/alert, oriented only to person but not to place, date or situation   LABORATORY DATA:  I have reviewed the data as listed Lab Results  Component Value Date   WBC 18.1 (H) 09/11/2018   HGB 12.3 (L) 09/11/2018   HCT 38.0 (L) 09/11/2018   MCV 89.4 09/11/2018   PLT 251 09/11/2018   Lab Results  Component Value Date   NA 146 (H) 09/11/2018   K 4.1 09/11/2018   CL 104 09/11/2018   CO2 29 09/11/2018    RADIOGRAPHIC STUDIES: I have personally reviewed the radiological images as listed and agreed with the findings in the report.

## 2018-09-09 ENCOUNTER — Institutional Professional Consult (permissible substitution): Payer: Self-pay | Admitting: Emergency Medicine

## 2018-09-09 ENCOUNTER — Other Ambulatory Visit: Payer: Self-pay | Admitting: Hematology

## 2018-09-09 DIAGNOSIS — R918 Other nonspecific abnormal finding of lung field: Secondary | ICD-10-CM

## 2018-09-11 ENCOUNTER — Inpatient Hospital Stay (HOSPITAL_BASED_OUTPATIENT_CLINIC_OR_DEPARTMENT_OTHER): Payer: Medicare Other | Admitting: Hematology

## 2018-09-11 ENCOUNTER — Encounter: Payer: Self-pay | Admitting: Hematology

## 2018-09-11 ENCOUNTER — Inpatient Hospital Stay: Payer: Medicare Other

## 2018-09-11 VITALS — BP 104/91 | HR 96 | Temp 97.7°F | Ht 70.0 in

## 2018-09-11 DIAGNOSIS — N183 Chronic kidney disease, stage 3 unspecified: Secondary | ICD-10-CM

## 2018-09-11 DIAGNOSIS — G309 Alzheimer's disease, unspecified: Secondary | ICD-10-CM | POA: Diagnosis not present

## 2018-09-11 DIAGNOSIS — R627 Adult failure to thrive: Secondary | ICD-10-CM

## 2018-09-11 DIAGNOSIS — F03918 Unspecified dementia, unspecified severity, with other behavioral disturbance: Secondary | ICD-10-CM

## 2018-09-11 DIAGNOSIS — Z7189 Other specified counseling: Secondary | ICD-10-CM | POA: Diagnosis not present

## 2018-09-11 DIAGNOSIS — R918 Other nonspecific abnormal finding of lung field: Secondary | ICD-10-CM

## 2018-09-11 DIAGNOSIS — Z87891 Personal history of nicotine dependence: Secondary | ICD-10-CM | POA: Diagnosis not present

## 2018-09-11 DIAGNOSIS — F0391 Unspecified dementia with behavioral disturbance: Secondary | ICD-10-CM

## 2018-09-11 LAB — CMP (CANCER CENTER ONLY)
ALT: 9 U/L (ref 0–44)
ANION GAP: 13 (ref 5–15)
AST: 16 U/L (ref 15–41)
Albumin: 3.7 g/dL (ref 3.5–5.0)
Alkaline Phosphatase: 82 U/L (ref 38–126)
BUN: 40 mg/dL — ABNORMAL HIGH (ref 8–23)
CALCIUM: 9.9 mg/dL (ref 8.9–10.3)
CHLORIDE: 104 mmol/L (ref 98–111)
CO2: 29 mmol/L (ref 22–32)
Creatinine: 1.89 mg/dL — ABNORMAL HIGH (ref 0.61–1.24)
GFR, Est AFR Am: 39 mL/min — ABNORMAL LOW (ref >60)
GFR, Estimated: 34 mL/min — ABNORMAL LOW (ref >60)
Glucose, Bld: 157 mg/dL — ABNORMAL HIGH (ref 70–99)
Potassium: 4.1 mmol/L (ref 3.5–5.1)
SODIUM: 146 mmol/L — AB (ref 135–145)
Total Bilirubin: 0.5 mg/dL (ref 0.3–1.2)
Total Protein: 8.8 g/dL — ABNORMAL HIGH (ref 6.5–8.1)

## 2018-09-11 LAB — CBC WITH DIFFERENTIAL (CANCER CENTER ONLY)
Abs Immature Granulocytes: 0.08 10*3/uL — ABNORMAL HIGH (ref 0.00–0.07)
Basophils Absolute: 0 10*3/uL (ref 0.0–0.1)
Basophils Relative: 0 %
EOS PCT: 3 %
Eosinophils Absolute: 0.5 10*3/uL (ref 0.0–0.5)
HCT: 38 % — ABNORMAL LOW (ref 39.0–52.0)
Hemoglobin: 12.3 g/dL — ABNORMAL LOW (ref 13.0–17.0)
Immature Granulocytes: 0 %
Lymphocytes Relative: 15 %
Lymphs Abs: 2.6 10*3/uL (ref 0.7–4.0)
MCH: 28.9 pg (ref 26.0–34.0)
MCHC: 32.4 g/dL (ref 30.0–36.0)
MCV: 89.4 fL (ref 80.0–100.0)
Monocytes Absolute: 1.3 10*3/uL — ABNORMAL HIGH (ref 0.1–1.0)
Monocytes Relative: 7 %
NRBC: 0 % (ref 0.0–0.2)
Neutro Abs: 13.6 10*3/uL — ABNORMAL HIGH (ref 1.7–7.7)
Neutrophils Relative %: 75 %
Platelet Count: 251 10*3/uL (ref 150–400)
RBC: 4.25 MIL/uL (ref 4.22–5.81)
RDW: 13.5 % (ref 11.5–15.5)
WBC: 18.1 10*3/uL — AB (ref 4.0–10.5)

## 2018-09-13 ENCOUNTER — Ambulatory Visit (INDEPENDENT_AMBULATORY_CARE_PROVIDER_SITE_OTHER): Payer: Medicare Other | Admitting: Pulmonary Disease

## 2018-09-13 ENCOUNTER — Encounter: Payer: Self-pay | Admitting: Pulmonary Disease

## 2018-09-13 VITALS — BP 108/68 | HR 68

## 2018-09-13 DIAGNOSIS — M6250 Muscle wasting and atrophy, not elsewhere classified, unspecified site: Secondary | ICD-10-CM | POA: Diagnosis not present

## 2018-09-13 DIAGNOSIS — R64 Cachexia: Secondary | ICD-10-CM

## 2018-09-13 DIAGNOSIS — E43 Unspecified severe protein-calorie malnutrition: Secondary | ICD-10-CM | POA: Diagnosis not present

## 2018-09-13 DIAGNOSIS — J984 Other disorders of lung: Secondary | ICD-10-CM

## 2018-09-13 DIAGNOSIS — R918 Other nonspecific abnormal finding of lung field: Secondary | ICD-10-CM

## 2018-09-13 NOTE — Progress Notes (Signed)
Synopsis: Referred in November 2019 for bilateral lung mass.  Subjective:   PATIENT ID: Edwin [name] GENDER: male DOB: 10-29-1945, MRN: [id number]  Chief Complaint  Patient presents with  . Consult    possible lung cancer? resident at [facility] facility.     Patient has a past medical history of COPD, severe peripheral vascular disease.  Patient had imaging in October 2019 which revealed a large right upper lobe mass as well as a smaller cavitary thick-walled mass within the left upper lobe.  He does have significant emphysema seen on imaging.  Patient has baseline significant Alzheimer's dementia.  He has not been able to feed himself for the past week or more.  He was seen by oncology which was concerned due to his significant debility.  Patient has lost a significant amount of weight per the caregiver which is present today.  Family has also present today and have discussed the risk versus benefits versus alternatives of proceeding with biopsy.  They are under the impression that they would not want to pursue medical therapy or treatment for his underlying potential lung cancer.  The patient is widowed.  He has no children.  People that are making his medical decisions include 10 of his brothers and sisters.  Patient is present today with a sister-in-law and brother.   Past Medical History:  Diagnosis Date  . Anxiety   . Chest pain   . COPD (chronic obstructive pulmonary disease) ([place])   . Diabetes mellitus without complication ([place])   . PVD (peripheral vascular disease) (HCC)    Severe with occlusion of L PTA, ATA, and DPA, L extremity  . 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      Family History  Family history unknown: Yes     Past Surgical History:  Procedure Laterality Date  . ABDOMINAL AORTOGRAM W/LOWER EXTREMITY N/A 05/13/2018   Procedure: ABDOMINAL AORTOGRAM W/LOWER EXTREMITY;   Surgeon: Waynetta Sandy, MD;  Location: [facility];  Service: Cardiovascular;  Laterality: N/A;    Social History   Socioeconomic History  . Marital status: Single    Spouse name: Not on file  . Number of children: Not on file  . Years of education: Not on file  . Highest education level: Not on file  Occupational History  . Not on file  Social Needs  . Financial resource strain: Not on file  . Food insecurity:    Worry: Not on file    Inability: Not on file  . Transportation needs:    Medical: Not on file    Non-medical: Not on file  Tobacco Use  . Smoking status: Former Smoker    Types: Cigarettes  . Smokeless tobacco: Never Used  Substance and Sexual Activity  . Alcohol use: No    Alcohol/week: 0.0 standard drinks  . Drug use: No  . Sexual activity: Not on file  Lifestyle  . Physical activity:    Days per week: Not on file    Minutes per session: Not on file  . Stress: Not on file  Relationships  . Social connections:    Talks on phone: Not on file    Gets together: Not on file    Attends religious service: Not on file    Active member of club or organization: Not on file    Attends meetings of clubs or organizations: Not on file  Relationship status: Not on file  . Intimate partner violence:    Fear of current or ex partner: Not on file    Emotionally abused: Not on file    Physically abused: Not on file    Forced sexual activity: Not on file  Other Topics Concern  . Not on file  Social History Narrative  . Not on file     No Known Allergies   Outpatient Medications Prior to Visit  Medication Sig Dispense Refill  . acetaminophen (TYLENOL) 500 MG tablet Take 500 mg by mouth every 4 (four) hours as needed for moderate pain.    Marland Kitchen alum & mag hydroxide-simeth (MINTOX) 315-176-16 MG/5ML suspension Take 30 mLs by mouth every 6 (six) hours as needed for indigestion or heartburn.    Marland Kitchen amLODipine (NORVASC) 5 MG tablet Take 1 tablet (5 mg  total) by mouth daily. 30 tablet 0  . aspirin 81 MG chewable tablet Chew 81 mg by mouth daily.    . cetirizine (ZYRTEC) 10 MG tablet Take 10 mg by mouth daily as needed for allergies.    . clindamycin (CLEOCIN T) 1 % external solution Apply 1 application topically 2 (two) times daily as needed (pustules).    . famotidine (PEPCID) 20 MG tablet Take 1 tablet (20 mg total) by mouth 2 (two) times daily. (Patient taking differently: Take 20 mg by mouth at bedtime. ) 30 tablet 0  . Fluticasone-Salmeterol (WIXELA INHUB) 250-50 MCG/DOSE AEPB Inhale 1 puff into the lungs 2 (two) times daily.    Marland Kitchen gabapentin (NEURONTIN) 100 MG capsule Take 100 mg by mouth 3 (three) times daily.     Marland Kitchen guaiFENesin (ROBITUSSIN) 100 MG/5ML liquid Take 200 mg by mouth every 6 (six) hours as needed for cough.    Marland Kitchen ipratropium-albuterol (DUONEB) 0.5-2.5 (3) MG/3ML SOLN Take 3 mLs by nebulization every 4 (four) hours as needed (sob and wheezing).    Marland Kitchen loperamide (IMODIUM) 2 MG capsule Take 2 mg by mouth as needed for diarrhea or loose stools.    Marland Kitchen LORazepam (ATIVAN) 0.5 MG tablet Take 0.25 mg by mouth 3 (three) times daily as needed for anxiety.    Marland Kitchen LORazepam (ATIVAN) 0.5 MG tablet Take 0.5 mg by mouth 2 (two) times daily.    . magnesium hydroxide (MILK OF MAGNESIA) 400 MG/5ML suspension Take 30 mLs by mouth at bedtime as needed for mild constipation.     . metFORMIN (GLUCOPHAGE) 1000 MG tablet Take 1,000 mg by mouth 2 (two) times daily with a meal.    . MINERAL OIL LIGHT EX Apply 1 application topically daily as needed (dry skin).     . mirtazapine (REMERON) 7.5 MG tablet Take 7.5 mg by mouth at bedtime.    Marland Kitchen neomycin-bacitracin-polymyxin (NEOSPORIN) [phone] ointment Apply 1 application topically daily as needed (skin tear).    . Nutritional Supplements (NUTRITIONAL SHAKE) LIQD Take 1 each by mouth 3 (three) times daily.    . QUEtiapine (SEROQUEL) 25 MG tablet Take 75 mg by mouth at bedtime.     . sertraline (ZOLOFT) 50 MG  tablet Take 75 mg by mouth daily.    . simvastatin (ZOCOR) 20 MG tablet Take 1 tablet (20 mg total) by mouth every evening. 30 tablet 0  . traMADol (ULTRAM) 50 MG tablet Take 50 mg by mouth at bedtime.     . triamterene-hydrochlorothiazide (MAXZIDE-25) 37.5-25 MG per tablet Take 0.5 tablets by mouth daily. 30 tablet 0   No facility-administered medications prior to visit.  Review of Systems  Constitutional: Positive for malaise/fatigue and weight loss.  Unable to respond to questions due to advanced dementia.  Caregiver endorses significant weight loss   Objective:  Physical Exam  Constitutional: He is oriented to person, place, and time. He appears well-developed. No distress.  HENT:  Head: Normocephalic and atraumatic.  Mouth/Throat: Oropharynx is clear and moist. No oropharyngeal exudate.  Eyes: Pupils are equal, round, and reactive to light. Conjunctivae and EOM are normal.  Neck: No JVD present. No tracheal deviation present.  Loss of supraclavicular fat  Cardiovascular: Normal rate, regular rhythm, S1 normal, S2 normal and intact distal pulses.  Distant heart tones  Pulmonary/Chest: No accessory muscle usage or stridor. No tachypnea. He has decreased breath sounds (throughout all lung fields). He has wheezes. He has no rhonchi. He has no rales.  Increased AP chest diameter  Abdominal: Soft. Bowel sounds are normal. He exhibits no distension. There is no tenderness.  Musculoskeletal: He exhibits deformity (muscle wasting ). He exhibits no edema.  Neurological: He is alert and oriented to person, place, and time.  Skin: Skin is warm and dry. Capillary refill takes less than 2 seconds. No rash noted.  Psychiatric: He has a normal mood and affect. His behavior is normal.  Vitals reviewed.    Vitals:   09/13/18 1118  BP: 108/68  Pulse: 68  SpO2: 97%   97% on RA BMI Readings from Last 3 Encounters:  09/11/18 15.78 kg/m  05/13/18 15.78 kg/m  05/07/18 15.78 kg/m    Wt Readings from Last 3 Encounters:  05/13/18 110 lb (49.9 kg)  05/07/18 110 lb (49.9 kg)  04/30/18 110 lb (49.9 kg)     CBC    Component Value Date/Time   WBC 18.1 (H) 09/11/2018 1245   WBC 14.5 (H) 08/03/2018 2031   RBC 4.25 09/11/2018 1245   HGB 12.3 (L) 09/11/2018 1245   HCT 38.0 (L) 09/11/2018 1245   PLT 251 09/11/2018 1245   MCV 89.4 09/11/2018 1245   MCH 28.9 09/11/2018 1245   MCHC 32.4 09/11/2018 1245   RDW 13.5 09/11/2018 1245   LYMPHSABS 2.6 09/11/2018 1245   MONOABS 1.3 (H) 09/11/2018 1245   EOSABS 0.5 09/11/2018 1245   BASOSABS 0.0 09/11/2018 1245    Chest Imaging: CT chest October 2019: Large right upper lobe lung mass, significant parenchymal centrilobular emphysema, left upper lobe cavitary lung mass.  Pulmonary Functions Testing Results: None      Assessment & Plan:   Lung mass  Mass of upper lobe of right lung  Cavitating mass of upper lobe of left lung  Severe protein-calorie malnutrition (HCC)  Muscular atrophy, unspecified site  Malignant cachexia ([place])  Discussion:  This is a 72 year old with advanced dementia with a current low ECOG.  Unlikely to tolerate chemotherapy for underlying lung cancer.  He was referred to see [place] today to discuss the risks, benefits versus alternatives of biopsy.  After long discussion with the patient's brother and sister-in-law present the decision was made to not pursue lung biopsy as they have no plans on pursuing chemotherapy.  The decision was also made by oncology not to recommend chemotherapy due to the patient's significant functional disability.  Therefore, the final decision was for referral to hospice.  We will make these recommendations to his current memory facility in which he resides.  I spoke with 1 of the representatives from his facility who states they will be able to transition him to hospice care within his current residence.  Please contact our office as needed for any questions.  Greater  than 50% of this patient 60-minute office visit was spent discussing the risk versus benefits vs alternatives of proceeding with biopsy regarding the patient's potential lung malignancy within the right upper lobe and left.   Current Outpatient Medications:  .  acetaminophen (TYLENOL) 500 MG tablet, Take 500 mg by mouth every 4 (four) hours as needed for moderate pain., Disp: , Rfl:  .  alum & mag hydroxide-simeth (MINTOX) [phone] MG/5ML suspension, Take 30 mLs by mouth every 6 (six) hours as needed for indigestion or heartburn., Disp: , Rfl:  .  amLODipine (NORVASC) 5 MG tablet, Take 1 tablet (5 mg total) by mouth daily., Disp: 30 tablet, Rfl: 0 .  aspirin 81 MG chewable tablet, Chew 81 mg by mouth daily., Disp: , Rfl:  .  cetirizine (ZYRTEC) 10 MG tablet, Take 10 mg by mouth daily as needed for allergies., Disp: , Rfl:  .  clindamycin (CLEOCIN T) 1 % external solution, Apply 1 application topically 2 (two) times daily as needed (pustules)., Disp: , Rfl:  .  famotidine (PEPCID) 20 MG tablet, Take 1 tablet (20 mg total) by mouth 2 (two) times daily. (Patient taking differently: Take 20 mg by mouth at bedtime. ), Disp: 30 tablet, Rfl: 0 .  Fluticasone-Salmeterol (WIXELA INHUB) 250-50 MCG/DOSE AEPB, Inhale 1 puff into the lungs 2 (two) times daily., Disp: , Rfl:  .  gabapentin (NEURONTIN) 100 MG capsule, Take 100 mg by mouth 3 (three) times daily. , Disp: , Rfl:  .  guaiFENesin (ROBITUSSIN) 100 MG/5ML liquid, Take 200 mg by mouth every 6 (six) hours as needed for cough., Disp: , Rfl:  .  ipratropium-albuterol (DUONEB) 0.5-2.5 (3) MG/3ML SOLN, Take 3 mLs by nebulization every 4 (four) hours as needed (sob and wheezing)., Disp: , Rfl:  .  loperamide (IMODIUM) 2 MG capsule, Take 2 mg by mouth as needed for diarrhea or loose stools., Disp: , Rfl:  .  LORazepam (ATIVAN) 0.5 MG tablet, Take 0.25 mg by mouth 3 (three) times daily as needed for anxiety., Disp: , Rfl:  .  LORazepam (ATIVAN) 0.5 MG tablet,  Take 0.5 mg by mouth 2 (two) times daily., Disp: , Rfl:  .  magnesium hydroxide (MILK OF MAGNESIA) 400 MG/5ML suspension, Take 30 mLs by mouth at bedtime as needed for mild constipation. , Disp: , Rfl:  .  metFORMIN (GLUCOPHAGE) 1000 MG tablet, Take 1,000 mg by mouth 2 (two) times daily with a meal., Disp: , Rfl:  .  MINERAL OIL LIGHT EX, Apply 1 application topically daily as needed (dry skin). , Disp: , Rfl:  .  mirtazapine (REMERON) 7.5 MG tablet, Take 7.5 mg by mouth at bedtime., Disp: , Rfl:  .  neomycin-bacitracin-polymyxin (NEOSPORIN) [phone] ointment, Apply 1 application topically daily as needed (skin tear)., Disp: , Rfl:  .  Nutritional Supplements (NUTRITIONAL SHAKE) LIQD, Take 1 each by mouth 3 (three) times daily., Disp: , Rfl:  .  QUEtiapine (SEROQUEL) 25 MG tablet, Take 75 mg by mouth at bedtime. , Disp: , Rfl:  .  sertraline (ZOLOFT) 50 MG tablet, Take 75 mg by mouth daily., Disp: , Rfl:  .  simvastatin (ZOCOR) 20 MG tablet, Take 1 tablet (20 mg total) by mouth every evening., Disp: 30 tablet, Rfl: 0 .  traMADol (ULTRAM) 50 MG tablet, Take 50 mg by mouth at bedtime. , Disp: , Rfl:  .  triamterene-hydrochlorothiazide (MAXZIDE-25) 37.5-25 MG per tablet, Take 0.5 tablets  by mouth daily., Disp: 30 tablet, Rfl: 0   Garner Nash, DO [facility] 09/13/2018 12:02 PM

## 2018-09-13 NOTE — Patient Instructions (Addendum)
Thank you for visiting Dr. Valeta Harms at [facility]. Today we recommend the following:  Please refer to Hospice.

## 2018-09-23 ENCOUNTER — Institutional Professional Consult (permissible substitution): Payer: Self-pay | Admitting: Pulmonary Disease

## 2018-10-21 ENCOUNTER — Other Ambulatory Visit: Payer: Self-pay

## 2018-10-21 ENCOUNTER — Emergency Department (HOSPITAL_COMMUNITY)
Admission: EM | Admit: 2018-10-21 | Discharge: 2018-10-21 | Disposition: A | Discharge status: Home / Self Care | Attending: Emergency Medicine | Admitting: Emergency Medicine

## 2018-10-21 ENCOUNTER — Encounter (HOSPITAL_COMMUNITY): Payer: Self-pay

## 2018-10-21 ENCOUNTER — Emergency Department (HOSPITAL_COMMUNITY)

## 2018-10-21 DIAGNOSIS — Y92129 Unspecified place in nursing home as the place of occurrence of the external cause: Secondary | ICD-10-CM | POA: Diagnosis not present

## 2018-10-21 DIAGNOSIS — Z79899 Other long term (current) drug therapy: Secondary | ICD-10-CM | POA: Diagnosis not present

## 2018-10-21 DIAGNOSIS — S0990XA Unspecified injury of head, initial encounter: Secondary | ICD-10-CM | POA: Diagnosis present

## 2018-10-21 DIAGNOSIS — S01111A Laceration without foreign body of right eyelid and periocular area, initial encounter: Secondary | ICD-10-CM | POA: Diagnosis not present

## 2018-10-21 DIAGNOSIS — N183 Chronic kidney disease, stage 3 (moderate): Secondary | ICD-10-CM | POA: Insufficient documentation

## 2018-10-21 DIAGNOSIS — Z23 Encounter for immunization: Secondary | ICD-10-CM | POA: Diagnosis not present

## 2018-10-21 DIAGNOSIS — E1122 Type 2 diabetes mellitus with diabetic chronic kidney disease: Secondary | ICD-10-CM | POA: Insufficient documentation

## 2018-10-21 DIAGNOSIS — Y939 Activity, unspecified: Secondary | ICD-10-CM | POA: Diagnosis not present

## 2018-10-21 DIAGNOSIS — Z7984 Long term (current) use of oral hypoglycemic drugs: Secondary | ICD-10-CM | POA: Insufficient documentation

## 2018-10-21 DIAGNOSIS — Y999 Unspecified external cause status: Secondary | ICD-10-CM | POA: Diagnosis not present

## 2018-10-21 DIAGNOSIS — S0181XA Laceration without foreign body of other part of head, initial encounter: Secondary | ICD-10-CM

## 2018-10-21 DIAGNOSIS — I129 Hypertensive chronic kidney disease with stage 1 through stage 4 chronic kidney disease, or unspecified chronic kidney disease: Secondary | ICD-10-CM | POA: Diagnosis not present

## 2018-10-21 DIAGNOSIS — W19XXXA Unspecified fall, initial encounter: Secondary | ICD-10-CM | POA: Insufficient documentation

## 2018-10-21 DIAGNOSIS — J449 Chronic obstructive pulmonary disease, unspecified: Secondary | ICD-10-CM | POA: Diagnosis not present

## 2018-10-21 DIAGNOSIS — Z7982 Long term (current) use of aspirin: Secondary | ICD-10-CM | POA: Insufficient documentation

## 2018-10-21 MED ORDER — LIDOCAINE-EPINEPHRINE-TETRACAINE (LET) SOLUTION
3.0000 mL | Freq: Once | NASAL | Status: DC
  Filled 2018-10-21: qty 3

## 2018-10-21 MED ORDER — TETANUS-DIPHTH-ACELL PERTUSSIS 5-2.5-18.5 LF-MCG/0.5 IM SUSP
0.5000 mL | Freq: Once | INTRAMUSCULAR | Status: AC
  Administered 2018-10-21: 0.5 mL via INTRAMUSCULAR
  Filled 2018-10-21: qty 0.5

## 2018-10-21 MED ORDER — LIDOCAINE-EPINEPHRINE-TETRACAINE (LET) SOLUTION
3.0000 mL | Freq: Once | NASAL | Status: AC
  Administered 2018-10-21: 3 mL via TOPICAL

## 2018-10-21 MED ORDER — LIDOCAINE-EPINEPHRINE-TETRACAINE (LET) SOLUTION
3.0000 mL | Freq: Once | NASAL | Status: AC
  Administered 2018-10-21: 3 mL via TOPICAL
  Filled 2018-10-21: qty 3

## 2018-10-21 NOTE — ED Notes (Signed)
[facility](PT transport to [facility]) called @ 1326-per Jerene Pitch, RN-called by Levada Dy

## 2018-10-21 NOTE — ED Triage Notes (Signed)
[facility]- pt coming in from [facility] with complaint of unwitnessed fall. Pt has small lac to his right forehead.

## 2018-10-21 NOTE — Discharge Instructions (Signed)
Your fall prompted [place] to get imaging of your head and neck.  No fractures, bleeding, or traumatic injuries were seen.  Your laceration was washed out and repaired with 2 absorbable stitches.  They did not need to be removed as they will be absorbed.  We updated your tetanus.  Please follow-up with your primary doctor and stay hydrated.  If any symptoms change or worsen, please return to the nearest emergency department.

## 2018-10-21 NOTE — Progress Notes (Signed)
Hospice and [facility]) Hospital Liaison note.  Visited with patient at bedside. He is awake and alert. No apparent distress. He has a small laceration to his right forehead.  Per ED nurse his CT is negative. The plan is to suture site and return to home.   Communication with [facility]: contacted Barbee Shropshire and updated her on his condition and plan to return home.   Communication with [facility]: Team updated.   Please call with any hospice related questions or concerns. Please call [facility] for transportation home.   Thank you,  Farrel Gordon, RN, [facility] Liaison (listed on [place])  [phone]

## 2018-10-21 NOTE — ED Provider Notes (Signed)
[facility] EMERGENCY DEPARTMENT Provider Note   CSN: [id number] Arrival date & time: 10/21/18  0920     History   Chief Complaint Chief Complaint  Patient presents with  . Fall    HPI Edwin [name] is a 72 y.o. male.  The history is provided by the EMS personnel, the patient and medical records. No language interpreter was used.  Fall  This is a new problem. The current episode started 1 to 2 hours ago. The problem occurs constantly. The problem has been resolved. Pertinent negatives include no chest pain, no abdominal pain, no headaches (intitialy then resolved) and no shortness of breath. Nothing aggravates the symptoms. Nothing relieves the symptoms. He has tried nothing for the symptoms. The treatment provided no relief.    Past Medical History:  Diagnosis Date  . Anxiety   . Chest pain   . COPD (chronic obstructive pulmonary disease) ([place])   . Diabetes mellitus without complication ([place])   . PVD (peripheral vascular disease) ([facility])    Severe with occlusion of L PTA, ATA, and DPA, L extremity  . 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     Patient Active Problem List   Diagnosis Date Noted  . Goals of care, counseling/discussion 09/11/2018  . Lung mass 08/23/2018  . Failure to thrive in adult   . 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 Encounter for preadmission testing   . Hyperglycemia 02/23/2015  . CKD (chronic kidney disease), stage III ([place]) 02/23/2015  . DM 10/02/2007  . HYPERTENSION 10/02/2007  . MYOCARDIAL INFARCTION 10/02/2007  . ALLERGIC RHINITIS 10/02/2007  . EMPHYSEMA 10/02/2007  . COPD 10/02/2007  . PULMONARY NODULE 10/02/2007  . HEADACHE, CHRONIC 10/02/2007  . HYPERGLYCEMIA 10/02/2007    Past Surgical History:  Procedure Laterality Date  . ABDOMINAL AORTOGRAM W/LOWER EXTREMITY N/A 05/13/2018   Procedure: ABDOMINAL AORTOGRAM  W/LOWER EXTREMITY;  Surgeon: Waynetta Sandy, MD;  Location: [facility];  Service: Cardiovascular;  Laterality: N/A;        Home Medications    Prior to Admission medications   Medication Sig Start Date End Date Taking? Authorizing Provider  acetaminophen (TYLENOL) 500 MG tablet Take 500 mg by mouth every 4 (four) hours as needed for moderate pain.    [provider]  alum & mag hydroxide-simeth ([place]) 200-200-20 MG/5ML suspension Take 30 mLs by mouth every 6 (six) hours as needed for indigestion or heartburn.    [provider]  amLODipine (NORVASC) 5 MG tablet Take 1 tablet (5 mg total) by mouth daily. 03/01/15   Delfin Gant, NP  aspirin 81 MG chewable tablet Chew 81 mg by mouth daily.    [provider]  cetirizine (ZYRTEC) 10 MG tablet Take 10 mg by mouth daily as needed for allergies.    [provider]  clindamycin (CLEOCIN T) 1 % external solution Apply 1 application topically 2 (two) times daily as needed (pustules).    [provider]  famotidine (PEPCID) 20 MG tablet Take 1 tablet (20 mg total) by mouth 2 (two) times daily. Patient taking differently: Take 20 mg by mouth at bedtime.  03/01/15   Delfin Gant, NP  Fluticasone-Salmeterol (WIXELA INHUB) 250-50 MCG/DOSE AEPB Inhale 1 puff into the lungs 2 (two) times daily.    [provider]  gabapentin (NEURONTIN) 100 MG capsule Take 100 mg by mouth 3 (three) times daily.  [provider]  guaiFENesin (ROBITUSSIN) 100 MG/5ML liquid Take 200 mg by mouth every 6 (six) hours as needed for cough.    [provider]  ipratropium-albuterol (DUONEB) 0.5-2.5 (3) MG/3ML SOLN Take 3 mLs by nebulization every 4 (four) hours as needed (sob and wheezing).    [provider]  loperamide (IMODIUM) 2 MG capsule Take 2 mg by mouth as needed for diarrhea or loose stools.    [provider]  LORazepam (ATIVAN) 0.5 MG tablet Take 0.25  mg by mouth 3 (three) times daily as needed for anxiety. 07/24/18   [provider]  LORazepam (ATIVAN) 0.5 MG tablet Take 0.5 mg by mouth 2 (two) times daily.    [provider]  magnesium hydroxide (MILK OF MAGNESIA) 400 MG/5ML suspension Take 30 mLs by mouth at bedtime as needed for mild constipation.     [provider]  metFORMIN (GLUCOPHAGE) 1000 MG tablet Take 1,000 mg by mouth 2 (two) times daily with a meal.    [provider]  MINERAL OIL LIGHT EX Apply 1 application topically daily as needed (dry skin).     [provider]  mirtazapine (REMERON) 7.5 MG tablet Take 7.5 mg by mouth at bedtime.    [provider]  neomycin-bacitracin-polymyxin (NEOSPORIN) [phone] ointment Apply 1 application topically daily as needed (skin tear).    [provider]  Nutritional Supplements (NUTRITIONAL SHAKE) LIQD Take 1 each by mouth 3 (three) times daily.    [provider]  QUEtiapine (SEROQUEL) 25 MG tablet Take 75 mg by mouth at bedtime.  03/24/18   [provider]  sertraline (ZOLOFT) 50 MG tablet Take 75 mg by mouth daily.    [provider]  simvastatin (ZOCOR) 20 MG tablet Take 1 tablet (20 mg total) by mouth every evening. 03/01/15   Delfin Gant, NP  traMADol (ULTRAM) 50 MG tablet Take 50 mg by mouth at bedtime.  04/15/18   [provider]  triamterene-hydrochlorothiazide (MAXZIDE-25) 37.5-25 MG per tablet Take 0.5 tablets by mouth daily. 03/01/15   Delfin Gant, NP    Family History Family History  Family history unknown: Yes    Social History Social History   Tobacco Use  . Smoking status: Former Smoker    Types: Cigarettes  . Smokeless tobacco: Never Used  Substance Use Topics  . Alcohol use: No    Alcohol/week: 0.0 standard drinks  . Drug use: No     Allergies   Patient has no known allergies.   Review of Systems Review of Systems  Constitutional: Negative for  chills, diaphoresis, fatigue and fever.  HENT: Negative for congestion, nosebleeds and rhinorrhea.   Eyes: Negative for visual disturbance.  Respiratory: Negative for chest tightness, shortness of breath, wheezing and stridor.   Cardiovascular: Negative for chest pain, palpitations and leg swelling.  Gastrointestinal: Negative for abdominal pain, constipation, diarrhea, nausea and vomiting.  Genitourinary: Negative for flank pain.  Musculoskeletal: Negative for back pain, neck pain and neck stiffness.  Skin: Positive for wound. Negative for rash.  Neurological: Negative for headaches (intitialy then resolved).  Psychiatric/Behavioral: Negative for agitation.  All other systems reviewed and are negative.    Physical Exam Updated Vital Signs There were no vitals taken for this visit.  Physical Exam Constitutional:      General: He is not in acute distress.    Appearance: He is not ill-appearing, toxic-appearing or diaphoretic.  HENT:     Head: Abrasion, contusion and laceration  present.      Comments: Pupils reactive and symmetric.  Extraocular movements intact without significant pain.  No other facial instability or tenderness.  No nasal septal hematoma.    Nose: No congestion or rhinorrhea.     Mouth/Throat:     Pharynx: No oropharyngeal exudate.  Eyes:     Conjunctiva/sclera: Conjunctivae normal.     Pupils: Pupils are equal, round, and reactive to light.  Neck:     Musculoskeletal: No erythema, spinous process tenderness or muscular tenderness.      Comments: In C collar on arrival  Cardiovascular:     Rate and Rhythm: Normal rate.     Pulses: Normal pulses.     Heart sounds: No murmur.  Pulmonary:     Effort: Pulmonary effort is normal. No respiratory distress.     Breath sounds: Normal breath sounds. No stridor. No wheezing or rhonchi.  Chest:     Chest wall: No tenderness.  Abdominal:     General: Abdomen is flat.     Tenderness: There is no abdominal  tenderness.  Skin:    General: Skin is warm.     Capillary Refill: Capillary refill takes less than 2 seconds.  Neurological:     General: No focal deficit present.     Mental Status: He is alert. Mental status is at baseline. He is disoriented.  Psychiatric:        Mood and Affect: Mood normal.      ED Treatments / Results  Labs (all labs ordered are listed, but only abnormal results are displayed) Labs Reviewed - No data to display  EKG None  Radiology Ct Head Wo Contrast  Result Date: 10/21/2018 CLINICAL DATA:  72 year old male status post unwitnessed fall. Right forehead laceration. EXAM: CT HEAD WITHOUT CONTRAST CT CERVICAL SPINE WITHOUT CONTRAST TECHNIQUE: Multidetector CT imaging of the head and cervical spine was performed following the standard protocol without intravenous contrast. Multiplanar CT image reconstructions of the cervical spine were also generated. COMPARISON:  Head CT 02/23/2015. FINDINGS: CT HEAD FINDINGS Mild motion artifact. Brain: Cerebral volume is stable since 2016. Chronic confluent bilateral cerebral white matter hypodensity superimposed on chronic inferior bifrontal and right temporal lobe encephalomalacia. No midline shift, ventriculomegaly, mass effect, evidence of mass lesion, intracranial hemorrhage or evidence of cortically based acute infarction. Vascular: Calcified atherosclerosis at the skull base. No suspicious intracranial vascular hyperdensity. Skull: Motion artifact at the vertex. No skull fracture identified. Sinuses/Orbits: Chronic maxillary sinusitis with mucoperiosteal thickening greater on the right. Improved right maxillary aeration since 2016. Chronic right lamina papyracea fracture. Other Visualized paranasal sinuses and mastoids are stable and well pneumatized. Other: Right forehead scalp hematoma up to 6 millimeters in thickness. Allowing for motion the other scalp and the orbits soft tissues are within normal limits. CT CERVICAL SPINE  FINDINGS Alignment: Mild degenerative appearing anterolisthesis of C7 on T1. Mild straightening of cervical lordosis. Bilateral posterior element alignment is within normal limits. Skull base and vertebrae: Visualized skull base is intact. No atlanto-occipital dissociation. No cervical spine fracture identified. Soft tissues and spinal canal: No prevertebral fluid or swelling. No visible canal hematoma. Bulky cervical carotid calcified atherosclerosis. Benign appearing 4 centimeter lipoma overlying the posterior nuchal ligament in the midline (series 4, image 40). Disc levels: Disc and endplate degeneration except at C2-C3. Mild cervical spinal stenosis suspected at C6-C7. degenerative facet hypertrophy bilaterally at C7-T1. Upper chest: Apical bullous emphysema. Visible upper thoracic levels appear intact. IMPRESSION: 1. Right forehead scalp hematoma. No underlying  skull fracture identified. 2. No acute traumatic injury identified in the head or cervical spine. 3. Chronic cerebral white matter disease superimposed on chronic bifrontal and right temporal lobe encephalomalacia. 4. Bulky cervical carotid calcified atherosclerosis. 5. Benign appearing posterior neck lipoma. Electronically Signed   By: Genevie Ann M.D.   On: 10/21/2018 10:28   Ct Cervical Spine Wo Contrast  Result Date: 10/21/2018 CLINICAL DATA:  72 year old male status post unwitnessed fall. Right forehead laceration. EXAM: CT HEAD WITHOUT CONTRAST CT CERVICAL SPINE WITHOUT CONTRAST TECHNIQUE: Multidetector CT imaging of the head and cervical spine was performed following the standard protocol without intravenous contrast. Multiplanar CT image reconstructions of the cervical spine were also generated. COMPARISON:  Head CT 02/23/2015. FINDINGS: CT HEAD FINDINGS Mild motion artifact. Brain: Cerebral volume is stable since 2016. Chronic confluent bilateral cerebral white matter hypodensity superimposed on chronic inferior bifrontal and right temporal  lobe encephalomalacia. No midline shift, ventriculomegaly, mass effect, evidence of mass lesion, intracranial hemorrhage or evidence of cortically based acute infarction. Vascular: Calcified atherosclerosis at the skull base. No suspicious intracranial vascular hyperdensity. Skull: Motion artifact at the vertex. No skull fracture identified. Sinuses/Orbits: Chronic maxillary sinusitis with mucoperiosteal thickening greater on the right. Improved right maxillary aeration since 2016. Chronic right lamina papyracea fracture. Other Visualized paranasal sinuses and mastoids are stable and well pneumatized. Other: Right forehead scalp hematoma up to 6 millimeters in thickness. Allowing for motion the other scalp and the orbits soft tissues are within normal limits. CT CERVICAL SPINE FINDINGS Alignment: Mild degenerative appearing anterolisthesis of C7 on T1. Mild straightening of cervical lordosis. Bilateral posterior element alignment is within normal limits. Skull base and vertebrae: Visualized skull base is intact. No atlanto-occipital dissociation. No cervical spine fracture identified. Soft tissues and spinal canal: No prevertebral fluid or swelling. No visible canal hematoma. Bulky cervical carotid calcified atherosclerosis. Benign appearing 4 centimeter lipoma overlying the posterior nuchal ligament in the midline (series 4, image 40). Disc levels: Disc and endplate degeneration except at C2-C3. Mild cervical spinal stenosis suspected at C6-C7. degenerative facet hypertrophy bilaterally at C7-T1. Upper chest: Apical bullous emphysema. Visible upper thoracic levels appear intact. IMPRESSION: 1. Right forehead scalp hematoma. No underlying skull fracture identified. 2. No acute traumatic injury identified in the head or cervical spine. 3. Chronic cerebral white matter disease superimposed on chronic bifrontal and right temporal lobe encephalomalacia. 4. Bulky cervical carotid calcified atherosclerosis. 5. Benign  appearing posterior neck lipoma. Electronically Signed   By: Genevie Ann M.D.   On: 10/21/2018 10:28    Procedures .Marland KitchenLaceration Repair Date/Time: 10/21/2018 1:24 PM Performed by: Courtney Paris, MD Authorized by: Courtney Paris, MD   Consent:    Consent obtained:  Emergent situation   Risks discussed:  Infection, pain, poor cosmetic result and poor wound healing   Alternatives discussed:  No treatment Anesthesia (see MAR for exact dosages):    Anesthesia method:  Topical application   Topical anesthetic:  LET Laceration details:    Location:  Face   Face location:  R eyebrow   Length (cm):  0.5   Depth (mm):  1 Repair type:    Repair type:  Simple Pre-procedure details:    Preparation:  Patient was prepped and draped in usual sterile fashion Exploration:    Hemostasis achieved with:  LET   Wound exploration: wound explored through full range of motion and entire depth of wound probed and visualized     Contaminated: no   Treatment:    Area  cleansed with:  Saline   Amount of cleaning:  Standard   Irrigation solution:  Sterile saline   Irrigation method:  Syringe   Visualized foreign bodies/material removed: no   Skin repair:    Repair method:  Sutures   Suture size:  5-0   Suture material:  Fast-absorbing gut   Suture technique:  Simple interrupted   Number of sutures:  2 Approximation:    Approximation:  Close Post-procedure details:    Dressing:  Antibiotic ointment and non-adherent dressing   Patient tolerance of procedure:  Tolerated well, no immediate complications   (including critical care time)  Medications Ordered in ED Medications  lidocaine-EPINEPHrine-tetracaine (LET) solution (has no administration in time range)  Tdap (BOOSTRIX) injection 0.5 mL (0.5 mLs Intramuscular Given 10/21/18 1031)  lidocaine-EPINEPHrine-tetracaine (LET) solution (3 mLs Topical Given 10/21/18 1038)  lidocaine-EPINEPHrine-tetracaine (LET) solution (3 mLs Topical  Given 10/21/18 1229)     Initial Impression / Assessment and Plan / ED Course  I have reviewed the triage vital signs and the nursing notes.  Pertinent labs & imaging results that were available during my care of the patient were reviewed by me and considered in my medical decision making (see chart for details).     Edwin [name] is a 72 y.o. male with a past medical history significant for COPD, diabetes, hypertension, prior MI, peripheral vascular disease, and anxiety who presents from a memory care facility after a an unwitnessed fall.  According to EMS report from facility, patient was found on the ground next to his bed with a laceration/abrasion to his right eyebrow this morning.  There was blood on the ground.  Suspect patient rolled out of bed and sustained this injury.  Patient does not member the fall.  Patient was sent for rule out of head or neck injury and management of the wound.  Patient does not remember what happened and is denying complaints on arrival.  He is arrives in a c-collar.  He reportedly had some headache initially but that has improved.  Patient denies nausea, vomiting, neurologic deficits, vision changes.  He denies any chest pain, shortness of breath or abdominal pain.  He denies any preceding symptoms and facility told EMS he was at his mental status baseline.  Patient is only oriented to person and not to place and time.  On exam, patient has dried blood on his head and a small abrasion/laceration to his right eyebrow.  Will further clean and evaluate the wound after imaging.  Patient had normal strength and sensation in extremities.  Patient has a large bump on the back of his neck which he reports is chronic.  No significant neck tenderness.  Patient is unsure of what happened and is a difficult historian.  Lungs clear chest nontender.  Abdomen nontender.  No other lacerations seen.  Patient will have imaging of his head and neck.  Patient will be given a Tdap  as immunization appears out of date.  Anticipate management of his wound after return from imaging and if no other symptoms arise and work-up is reassuring, anticipate discharge back to his [facility] memory care facility.  10:33 AM Imaging returned showing no intracranial injury or C-spine injury.  Patient has a soft tissue hematoma and laceration seen.  No underlying fracture.  Further examination shows a 0.5 cm laceration.  Patient will have a let gel applied and wound will be repaired with likely a suture.  Small laceration was repaired without difficulty with absorbable sutures.  2 were placed.  No significant bleeding.  Wound was dressed with bacitracin and bandaging.  Patient be discharged back to facility after reassuring work-up for intracranial injury.    Final Clinical Impressions(s) / ED Diagnoses   Final diagnoses:  Fall, initial encounter  Laceration of forehead, initial encounter    ED Discharge Orders    None     Clinical Impression: 1. Fall, initial encounter   2. Laceration of forehead, initial encounter     Disposition: Discharge  Condition: Good  I have discussed the results, Dx and Tx plan with the pt(& family if present). He/she/they expressed understanding and agree(s) with the plan. Discharge instructions discussed at great length. Strict return precautions discussed and pt &/or family have verbalized understanding of the instructions. No further questions at time of discharge.    New Prescriptions   No medications on file    Follow Up: [facility] [address] [place] [address] [phone] Schedule an appointment as soon as possible for a visit    [facility] [address] [place]       Ronaldo Crilly, Gwenyth Allegra, MD 10/21/18 1325

## 2018-12-07 ENCOUNTER — Emergency Department (HOSPITAL_COMMUNITY)

## 2018-12-07 ENCOUNTER — Other Ambulatory Visit: Payer: Self-pay

## 2018-12-07 ENCOUNTER — Emergency Department (HOSPITAL_COMMUNITY)
Admission: EM | Admit: 2018-12-07 | Discharge: 2018-12-07 | Disposition: A | Discharge status: Home / Self Care | Attending: Emergency Medicine | Admitting: Emergency Medicine

## 2018-12-07 DIAGNOSIS — S0081XA Abrasion of other part of head, initial encounter: Secondary | ICD-10-CM | POA: Diagnosis not present

## 2018-12-07 DIAGNOSIS — I10 Essential (primary) hypertension: Secondary | ICD-10-CM

## 2018-12-07 DIAGNOSIS — S0990XA Unspecified injury of head, initial encounter: Secondary | ICD-10-CM | POA: Diagnosis present

## 2018-12-07 DIAGNOSIS — F0390 Unspecified dementia without behavioral disturbance: Secondary | ICD-10-CM | POA: Insufficient documentation

## 2018-12-07 DIAGNOSIS — Z7984 Long term (current) use of oral hypoglycemic drugs: Secondary | ICD-10-CM | POA: Insufficient documentation

## 2018-12-07 DIAGNOSIS — N183 Chronic kidney disease, stage 3 (moderate): Secondary | ICD-10-CM | POA: Diagnosis not present

## 2018-12-07 DIAGNOSIS — F419 Anxiety disorder, unspecified: Secondary | ICD-10-CM | POA: Diagnosis not present

## 2018-12-07 DIAGNOSIS — Z79899 Other long term (current) drug therapy: Secondary | ICD-10-CM | POA: Insufficient documentation

## 2018-12-07 DIAGNOSIS — I129 Hypertensive chronic kidney disease with stage 1 through stage 4 chronic kidney disease, or unspecified chronic kidney disease: Secondary | ICD-10-CM | POA: Diagnosis not present

## 2018-12-07 DIAGNOSIS — Y998 Other external cause status: Secondary | ICD-10-CM | POA: Diagnosis not present

## 2018-12-07 DIAGNOSIS — Y92128 Other place in nursing home as the place of occurrence of the external cause: Secondary | ICD-10-CM | POA: Insufficient documentation

## 2018-12-07 DIAGNOSIS — W01198A Fall on same level from slipping, tripping and stumbling with subsequent striking against other object, initial encounter: Secondary | ICD-10-CM | POA: Diagnosis not present

## 2018-12-07 DIAGNOSIS — Z87891 Personal history of nicotine dependence: Secondary | ICD-10-CM | POA: Insufficient documentation

## 2018-12-07 DIAGNOSIS — E1122 Type 2 diabetes mellitus with diabetic chronic kidney disease: Secondary | ICD-10-CM | POA: Insufficient documentation

## 2018-12-07 DIAGNOSIS — Y9389 Activity, other specified: Secondary | ICD-10-CM | POA: Insufficient documentation

## 2018-12-07 DIAGNOSIS — Z7982 Long term (current) use of aspirin: Secondary | ICD-10-CM | POA: Insufficient documentation

## 2018-12-07 DIAGNOSIS — I252 Old myocardial infarction: Secondary | ICD-10-CM | POA: Diagnosis not present

## 2018-12-07 DIAGNOSIS — J449 Chronic obstructive pulmonary disease, unspecified: Secondary | ICD-10-CM | POA: Insufficient documentation

## 2018-12-07 DIAGNOSIS — W19XXXA Unspecified fall, initial encounter: Secondary | ICD-10-CM

## 2018-12-07 NOTE — Discharge Instructions (Signed)
Today the CT scans of your head and your neck did not show any serious injuries from your fall.  You do have a scrape, also known as an abrasion, on the left forehead.  Please keep this area clean and dry, you may use antibiotic ointment on it as needed.

## 2018-12-07 NOTE — ED Triage Notes (Signed)
[facility], abrasion to left forehead.  AOx4, denies neck or back pain. Denies taking blood thinners, denies LOC.  Ambulatory at baseline.

## 2018-12-07 NOTE — ED Notes (Signed)
[facility] notified of need for transport. 

## 2018-12-07 NOTE — ED Provider Notes (Signed)
[facility] DEPT Provider Note   CSN: [id number] Arrival date & time: 12/07/18  1725     History   Chief Complaint Chief Complaint  Patient presents with  . Fall    HPI Edwin [name] is a 73 y.o. male with a past medical history of COPD, DM, lung cancer on hospice, however reportedly full code, dementia, who presents today for evaluation after a mechanical fall.  He lives at [facility] where he reportedly tripped and fell striking his left forehead on the patio.  He does not take any blood thinners.  He denies any headache, neck or back pain.  He has been ambulatory since he fell.  He states that he "tripped over his feet."  HPI  Past Medical History:  Diagnosis Date  . Anxiety   . Chest pain   . COPD (chronic obstructive pulmonary disease) ([place])   . Diabetes mellitus without complication ([place])   . PVD (peripheral vascular disease) (H[facility])    Severe with occlusion of L PTA, ATA, and DPA, L extremity  . 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     Patient Active Problem List   Diagnosis Date Noted  . Goals of care, counseling/discussion 09/11/2018  . Lung mass 08/23/2018  . Failure to thrive in adult   . 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 Encounter for preadmission testing   . Hyperglycemia 02/23/2015  . CKD (chronic kidney disease), stage III ([place]) 02/23/2015  . DM 10/02/2007  . HYPERTENSION 10/02/2007  . MYOCARDIAL INFARCTION 10/02/2007  . ALLERGIC RHINITIS 10/02/2007  . EMPHYSEMA 10/02/2007  . COPD 10/02/2007  . PULMONARY NODULE 10/02/2007  . HEADACHE, CHRONIC 10/02/2007  . HYPERGLYCEMIA 10/02/2007    Past Surgical History:  Procedure Laterality Date  . ABDOMINAL AORTOGRAM W/LOWER EXTREMITY N/A 05/13/2018   Procedure: ABDOMINAL AORTOGRAM W/LOWER EXTREMITY;  Surgeon: Waynetta Sandy, MD;  Location: [facility];   Service: Cardiovascular;  Laterality: N/A;        Home Medications    Prior to Admission medications   Medication Sig Start Date End Date Taking? Authorizing Provider  alum & mag hydroxide-simeth ([place]) 200-200-20 MG/5ML suspension Take 30 mLs by mouth every 6 (six) hours as needed for indigestion or heartburn.   Yes [provider]  amLODipine (NORVASC) 5 MG tablet Take 1 tablet (5 mg total) by mouth daily. 03/01/15  Yes Delfin Gant, NP  aspirin 81 MG chewable tablet Chew 81 mg by mouth daily.   Yes [provider]  clindamycin (CLEOCIN T) 1 % external solution Apply 1 application topically 2 (two) times daily as needed (pustules).   Yes [provider]  Fluticasone-Salmeterol (ADVAIR DISKUS) 250-50 MCG/DOSE AEPB Inhale 1 puff into the lungs 2 (two) times daily.   Yes [provider]  gabapentin (NEURONTIN) 100 MG capsule Take 200 mg by mouth 3 (three) times daily.    Yes [provider]  guaiFENesin (ROBITUSSIN) 100 MG/5ML liquid Take 200 mg by mouth every 6 (six) hours as needed for cough.   Yes [provider]  HYDROcodone-acetaminophen (NORCO/VICODIN) 5-325 MG tablet Take 1 tablet by mouth 3 (three) times daily.   Yes [provider]  ipratropium-albuterol (DUONEB) 0.5-2.5 (3) MG/3ML SOLN Take 3 mLs by nebulization every 4 (four) hours as needed (sob and wheezing).   Yes [provider]  loperamide (IMODIUM) 2 MG capsule Take 2 mg by  mouth as needed for diarrhea or loose stools.   Yes [provider]  LORazepam (ATIVAN) 0.5 MG tablet Take 0.25 mg by mouth 3 (three) times daily as needed for anxiety. 07/24/18  Yes [provider]  LORazepam (ATIVAN) 0.5 MG tablet Take 0.5 mg by mouth 2 (two) times daily.   Yes [provider]  magnesium hydroxide (MILK OF MAGNESIA) 400 MG/5ML suspension Take 30 mLs by mouth at bedtime as needed for mild constipation.    Yes [provider]    metFORMIN (GLUCOPHAGE) 500 MG tablet Take 500 mg by mouth 2 (two) times daily with a meal.   Yes [provider]  MINERAL OIL LIGHT EX Apply 1 application topically daily as needed (dry skin).    Yes [provider]  mirtazapine (REMERON) 7.5 MG tablet Take 7.5 mg by mouth at bedtime.   Yes [provider]  neomycin-bacitracin-polymyxin (NEOSPORIN) [phone] ointment Apply 1 application topically daily as needed (skin tear).   Yes [provider]  Nutritional Supplements (NUTRITIONAL SHAKE) LIQD Take 1 each by mouth 3 (three) times daily.   Yes [provider]  QUEtiapine (SEROQUEL) 25 MG tablet Take 75 mg by mouth at bedtime.  03/24/18  Yes [provider]  sennosides-docusate sodium (SENOKOT-S) 8.6-50 MG tablet Take 1 tablet by mouth daily.   Yes [provider]  sertraline (ZOLOFT) 50 MG tablet Take 75 mg by mouth daily.   Yes [provider]  Skin Protectants, Misc. (DIMETHICONE-ZINC OXIDE) cream Apply 1 application topically 2 (two) times daily as needed for dry skin.   Yes [provider]  famotidine (PEPCID) 20 MG tablet Take 1 tablet (20 mg total) by mouth 2 (two) times daily. Patient not taking: Reported on 12/07/2018 03/01/15   Delfin Gant, NP  simvastatin (ZOCOR) 20 MG tablet Take 1 tablet (20 mg total) by mouth every evening. Patient not taking: Reported on 12/07/2018 03/01/15   Delfin Gant, NP  triamterene-hydrochlorothiazide (MAXZIDE-25) 37.5-25 MG per tablet Take 0.5 tablets by mouth daily. Patient not taking: Reported on 12/07/2018 03/01/15   Delfin Gant, NP    Family History Family History  Family history unknown: Yes    Social History Social History   Tobacco Use  . Smoking status: Former Smoker    Types: Cigarettes  . Smokeless tobacco: Never Used  Substance Use Topics  . Alcohol use: No    Alcohol/week: 0.0 standard drinks  . Drug use: No     Allergies   Patient  has no known allergies.   Review of Systems Review of Systems  Constitutional: Negative for chills and fever.  Musculoskeletal: Negative for back pain and neck pain.  Neurological: Negative for weakness and headaches.  All other systems reviewed and are negative.    Physical Exam Updated Vital Signs BP (!) 163/104   Pulse 78   Temp 97.8 F (36.6 C) (Oral)   Resp 14   SpO2 95%   Physical Exam Vitals signs and nursing note reviewed.  Constitutional:      Appearance: He is well-developed.  HENT:     Head: Normocephalic.     Comments: Abrasion present over the left-sided anterior forehead extending into the brow line.  There is surrounding mild edema Eyes:     Conjunctiva/sclera: Conjunctivae normal.     Right eye: No hemorrhage.    Left eye: No hemorrhage.    Pupils: Pupils are equal, round, and reactive to light.  Neck:  Musculoskeletal: Neck supple.  Cardiovascular:     Rate and Rhythm: Normal rate and regular rhythm.     Pulses: Normal pulses.     Heart sounds: Normal heart sounds. No murmur.  Pulmonary:     Effort: Pulmonary effort is normal. Tachypnea present. No respiratory distress.     Breath sounds: Normal air entry. Rhonchi (Diffuse, bilaterally. ) present. No decreased breath sounds.  Abdominal:     Palpations: Abdomen is soft.     Tenderness: There is no abdominal tenderness.  Skin:    General: Skin is warm and dry.  Neurological:     Mental Status: He is alert.     Comments: Oriented to person, and place not to time.      ED Treatments / Results  Labs (all labs ordered are listed, but only abnormal results are displayed) Labs Reviewed - No data to display  EKG None  Radiology Ct Head Wo Contrast  Result Date: 12/07/2018 CLINICAL DATA:  Cervical trauma. EXAM: CT HEAD WITHOUT CONTRAST CT CERVICAL SPINE WITHOUT CONTRAST TECHNIQUE: Multidetector CT imaging of the head and cervical spine was performed following the standard protocol without  intravenous contrast. Multiplanar CT image reconstructions of the cervical spine were also generated. COMPARISON:  Head CT and cervical spine CT dated 10/21/2018. FINDINGS: CT HEAD FINDINGS Brain: Chronic small vessel ischemic changes again noted throughout the bilateral periventricular and subcortical white matter regions. Ventricles are stable in size. There is no mass, hemorrhage, edema or other evidence of acute parenchymal abnormality. No extra-axial hemorrhage. Vascular: No hyperdense vessel or unexpected calcification. Skull: Normal. Negative for fracture or focal lesion. Sinuses/Orbits: No acute finding. Other: Probable soft tissue edema overlying the LEFT lower frontal bone. No underlying fracture. CT CERVICAL SPINE FINDINGS Alignment: No evidence of acute vertebral body subluxation. Skull base and vertebrae: Evaluation of osseous detail is slightly limited by patient motion artifact, however, there is no fracture line or displaced fracture fragment identified. Facet joints appear intact and appropriately aligned throughout. Soft tissues and spinal canal: No prevertebral fluid or swelling. No visible canal hematoma. Disc levels: Mild degenerative spondylosis throughout the cervical spine. No more than mild central canal stenosis at any level. Upper chest: No acute findings. Extensive emphysematous changes at the lung apices. Other: Bilateral carotid atherosclerosis. IMPRESSION: 1. Probable soft tissue edema overlying the LEFT lower frontal bone. No underlying fracture. 2. No acute intracranial abnormality. No intracranial hemorrhage or edema. No skull fracture. 3. No fracture or acute subluxation within the cervical spine, with mild study limitations due to patient motion artifact. 4. Mild degenerative change within the cervical spine. 5. Emphysema. Emphysema (ICD10-J43.9). Electronically Signed   By: Franki Cabot M.D.   On: 12/07/2018 18:31   Ct Cervical Spine Wo Contrast  Result Date:  12/07/2018 CLINICAL DATA:  Cervical trauma. EXAM: CT HEAD WITHOUT CONTRAST CT CERVICAL SPINE WITHOUT CONTRAST TECHNIQUE: Multidetector CT imaging of the head and cervical spine was performed following the standard protocol without intravenous contrast. Multiplanar CT image reconstructions of the cervical spine were also generated. COMPARISON:  Head CT and cervical spine CT dated 10/21/2018. FINDINGS: CT HEAD FINDINGS Brain: Chronic small vessel ischemic changes again noted throughout the bilateral periventricular and subcortical white matter regions. Ventricles are stable in size. There is no mass, hemorrhage, edema or other evidence of acute parenchymal abnormality. No extra-axial hemorrhage. Vascular: No hyperdense vessel or unexpected calcification. Skull: Normal. Negative for fracture or focal lesion. Sinuses/Orbits: No acute finding. Other: Probable soft tissue edema overlying the  LEFT lower frontal bone. No underlying fracture. CT CERVICAL SPINE FINDINGS Alignment: No evidence of acute vertebral body subluxation. Skull base and vertebrae: Evaluation of osseous detail is slightly limited by patient motion artifact, however, there is no fracture line or displaced fracture fragment identified. Facet joints appear intact and appropriately aligned throughout. Soft tissues and spinal canal: No prevertebral fluid or swelling. No visible canal hematoma. Disc levels: Mild degenerative spondylosis throughout the cervical spine. No more than mild central canal stenosis at any level. Upper chest: No acute findings. Extensive emphysematous changes at the lung apices. Other: Bilateral carotid atherosclerosis. IMPRESSION: 1. Probable soft tissue edema overlying the LEFT lower frontal bone. No underlying fracture. 2. No acute intracranial abnormality. No intracranial hemorrhage or edema. No skull fracture. 3. No fracture or acute subluxation within the cervical spine, with mild study limitations due to patient motion  artifact. 4. Mild degenerative change within the cervical spine. 5. Emphysema. Emphysema (ICD10-J43.9). Electronically Signed   By: Franki Cabot M.D.   On: 12/07/2018 18:31    Procedures Procedures (including critical care time)  Medications Ordered in ED Medications - No data to display   Initial Impression / Assessment and Plan / ED Course  I have reviewed the triage vital signs and the nursing notes.  Pertinent labs & imaging results that were available during my care of the patient were reviewed by me and considered in my medical decision making (see chart for details).    Patient presents today for evaluation after a mechanical fall.  He was walking and tripped over his feat striking his head on the ground.  He is Alert and oriented x4.  CT head and neck were obtained given inability to apply [place] head/neck rules, both of which showed no acute abnormalities.  He has superficial abrasions on the left side of his head which were cleaned and dressed.    He denies other injuries from the fall.    His BP was elevated, recommend following up with PCP.  He was able to ambulate in the department.    Return precautions were discussed with patient who states their understanding.  At the time of discharge patient denied any unaddressed complaints or concerns.  Patient is agreeable for discharge home.  This patient was seen as a shared visit with Dr. Tomi Bamberger.   Final Clinical Impressions(s) / ED Diagnoses   Final diagnoses:  Fall, initial encounter  Facial abrasion, initial encounter  Hypertension, unspecified type    ED Discharge Orders    None       Ollen Gross 12/08/18 2033    Dorie Rank, MD 12/11/18 1334

## 2019-07-24 DEATH — deceased
